# Patient Record
Sex: Female | Born: 1950
Health system: Southern US, Community
[De-identification: ages and names within clinical notes are randomized; demographics above are authoritative.]

## PROBLEM LIST (undated history)

## (undated) DIAGNOSIS — I82409 Acute embolism and thrombosis of unspecified deep veins of unspecified lower extremity: Secondary | ICD-10-CM

## (undated) DIAGNOSIS — I10 Essential (primary) hypertension: Secondary | ICD-10-CM

## (undated) DIAGNOSIS — F419 Anxiety disorder, unspecified: Secondary | ICD-10-CM

## (undated) HISTORY — PX: NO PAST SURGERIES: SHX2092

---

## 2017-09-18 DIAGNOSIS — M2170 Unequal limb length (acquired), unspecified site: Secondary | ICD-10-CM | POA: Diagnosis not present

## 2017-09-18 DIAGNOSIS — J301 Allergic rhinitis due to pollen: Secondary | ICD-10-CM | POA: Diagnosis not present

## 2017-09-18 DIAGNOSIS — I10 Essential (primary) hypertension: Secondary | ICD-10-CM | POA: Diagnosis not present

## 2017-09-19 DIAGNOSIS — H2513 Age-related nuclear cataract, bilateral: Secondary | ICD-10-CM | POA: Diagnosis not present

## 2017-09-19 DIAGNOSIS — H04123 Dry eye syndrome of bilateral lacrimal glands: Secondary | ICD-10-CM | POA: Diagnosis not present

## 2017-10-08 DIAGNOSIS — M2170 Unequal limb length (acquired), unspecified site: Secondary | ICD-10-CM | POA: Diagnosis not present

## 2017-10-08 DIAGNOSIS — R102 Pelvic and perineal pain: Secondary | ICD-10-CM | POA: Diagnosis not present

## 2017-10-08 DIAGNOSIS — M5136 Other intervertebral disc degeneration, lumbar region: Secondary | ICD-10-CM | POA: Diagnosis not present

## 2017-11-21 DIAGNOSIS — E041 Nontoxic single thyroid nodule: Secondary | ICD-10-CM | POA: Diagnosis not present

## 2017-11-21 DIAGNOSIS — M25511 Pain in right shoulder: Secondary | ICD-10-CM | POA: Diagnosis not present

## 2017-11-21 DIAGNOSIS — S161XXA Strain of muscle, fascia and tendon at neck level, initial encounter: Secondary | ICD-10-CM | POA: Diagnosis not present

## 2017-11-21 DIAGNOSIS — M542 Cervicalgia: Secondary | ICD-10-CM | POA: Diagnosis not present

## 2017-11-21 DIAGNOSIS — R51 Headache: Secondary | ICD-10-CM | POA: Diagnosis not present

## 2017-11-21 DIAGNOSIS — S0990XA Unspecified injury of head, initial encounter: Secondary | ICD-10-CM | POA: Diagnosis not present

## 2017-11-21 DIAGNOSIS — S199XXA Unspecified injury of neck, initial encounter: Secondary | ICD-10-CM | POA: Diagnosis not present

## 2017-12-19 DIAGNOSIS — L819 Disorder of pigmentation, unspecified: Secondary | ICD-10-CM | POA: Diagnosis not present

## 2017-12-19 DIAGNOSIS — I10 Essential (primary) hypertension: Secondary | ICD-10-CM | POA: Diagnosis not present

## 2017-12-19 DIAGNOSIS — Z0000 Encounter for general adult medical examination without abnormal findings: Secondary | ICD-10-CM | POA: Diagnosis not present

## 2017-12-19 DIAGNOSIS — Z803 Family history of malignant neoplasm of breast: Secondary | ICD-10-CM | POA: Diagnosis not present

## 2017-12-19 DIAGNOSIS — F4322 Adjustment disorder with anxiety: Secondary | ICD-10-CM | POA: Diagnosis not present

## 2017-12-19 DIAGNOSIS — Z1231 Encounter for screening mammogram for malignant neoplasm of breast: Secondary | ICD-10-CM | POA: Diagnosis not present

## 2017-12-19 DIAGNOSIS — L2084 Intrinsic (allergic) eczema: Secondary | ICD-10-CM | POA: Diagnosis not present

## 2017-12-19 DIAGNOSIS — E041 Nontoxic single thyroid nodule: Secondary | ICD-10-CM | POA: Diagnosis not present

## 2018-02-05 DIAGNOSIS — E041 Nontoxic single thyroid nodule: Secondary | ICD-10-CM | POA: Diagnosis not present

## 2018-02-05 DIAGNOSIS — E042 Nontoxic multinodular goiter: Secondary | ICD-10-CM | POA: Diagnosis not present

## 2018-04-08 DIAGNOSIS — H6122 Impacted cerumen, left ear: Secondary | ICD-10-CM | POA: Diagnosis not present

## 2018-04-08 DIAGNOSIS — J301 Allergic rhinitis due to pollen: Secondary | ICD-10-CM | POA: Diagnosis not present

## 2018-04-08 DIAGNOSIS — H6983 Other specified disorders of Eustachian tube, bilateral: Secondary | ICD-10-CM | POA: Diagnosis not present

## 2018-06-16 DIAGNOSIS — D692 Other nonthrombocytopenic purpura: Secondary | ICD-10-CM | POA: Diagnosis not present

## 2018-06-16 DIAGNOSIS — C44519 Basal cell carcinoma of skin of other part of trunk: Secondary | ICD-10-CM | POA: Diagnosis not present

## 2018-06-16 DIAGNOSIS — D485 Neoplasm of uncertain behavior of skin: Secondary | ICD-10-CM | POA: Diagnosis not present

## 2018-06-16 DIAGNOSIS — L814 Other melanin hyperpigmentation: Secondary | ICD-10-CM | POA: Diagnosis not present

## 2018-06-16 DIAGNOSIS — L821 Other seborrheic keratosis: Secondary | ICD-10-CM | POA: Diagnosis not present

## 2018-06-19 DIAGNOSIS — I82461 Acute embolism and thrombosis of right calf muscular vein: Secondary | ICD-10-CM | POA: Diagnosis not present

## 2018-06-19 DIAGNOSIS — I82411 Acute embolism and thrombosis of right femoral vein: Secondary | ICD-10-CM | POA: Diagnosis not present

## 2018-06-19 DIAGNOSIS — M19079 Primary osteoarthritis, unspecified ankle and foot: Secondary | ICD-10-CM | POA: Diagnosis not present

## 2018-06-19 DIAGNOSIS — M7989 Other specified soft tissue disorders: Secondary | ICD-10-CM | POA: Diagnosis not present

## 2018-06-19 DIAGNOSIS — R600 Localized edema: Secondary | ICD-10-CM | POA: Diagnosis not present

## 2018-06-19 DIAGNOSIS — I82431 Acute embolism and thrombosis of right popliteal vein: Secondary | ICD-10-CM | POA: Diagnosis not present

## 2018-06-19 DIAGNOSIS — I82441 Acute embolism and thrombosis of right tibial vein: Secondary | ICD-10-CM | POA: Diagnosis not present

## 2018-06-22 DIAGNOSIS — I82431 Acute embolism and thrombosis of right popliteal vein: Secondary | ICD-10-CM | POA: Diagnosis not present

## 2018-07-15 DIAGNOSIS — Z13228 Encounter for screening for other metabolic disorders: Secondary | ICD-10-CM | POA: Diagnosis not present

## 2018-07-15 DIAGNOSIS — Z0000 Encounter for general adult medical examination without abnormal findings: Secondary | ICD-10-CM | POA: Diagnosis not present

## 2018-07-23 DIAGNOSIS — J329 Chronic sinusitis, unspecified: Secondary | ICD-10-CM | POA: Diagnosis not present

## 2018-07-23 DIAGNOSIS — H6983 Other specified disorders of Eustachian tube, bilateral: Secondary | ICD-10-CM | POA: Diagnosis not present

## 2018-07-28 DIAGNOSIS — E871 Hypo-osmolality and hyponatremia: Secondary | ICD-10-CM | POA: Diagnosis not present

## 2018-07-28 DIAGNOSIS — K591 Functional diarrhea: Secondary | ICD-10-CM | POA: Diagnosis not present

## 2018-07-28 DIAGNOSIS — I82431 Acute embolism and thrombosis of right popliteal vein: Secondary | ICD-10-CM | POA: Diagnosis not present

## 2018-08-06 DIAGNOSIS — J0140 Acute pansinusitis, unspecified: Secondary | ICD-10-CM | POA: Diagnosis not present

## 2018-08-06 DIAGNOSIS — I10 Essential (primary) hypertension: Secondary | ICD-10-CM | POA: Diagnosis not present

## 2018-08-06 DIAGNOSIS — F5102 Adjustment insomnia: Secondary | ICD-10-CM | POA: Diagnosis not present

## 2018-08-10 ENCOUNTER — Encounter: Payer: Self-pay | Admitting: Emergency Medicine

## 2018-08-10 ENCOUNTER — Emergency Department: Payer: PPO

## 2018-08-10 ENCOUNTER — Inpatient Hospital Stay
Admission: EM | Admit: 2018-08-10 | Discharge: 2018-08-14 | DRG: 640 | Disposition: A | Discharge status: Home / Self Care | Payer: PPO | Attending: Internal Medicine | Admitting: Internal Medicine

## 2018-08-10 DIAGNOSIS — F331 Major depressive disorder, recurrent, moderate: Secondary | ICD-10-CM | POA: Diagnosis present

## 2018-08-10 DIAGNOSIS — I639 Cerebral infarction, unspecified: Secondary | ICD-10-CM

## 2018-08-10 DIAGNOSIS — G9340 Encephalopathy, unspecified: Secondary | ICD-10-CM

## 2018-08-10 DIAGNOSIS — Z7189 Other specified counseling: Secondary | ICD-10-CM | POA: Diagnosis not present

## 2018-08-10 DIAGNOSIS — I6340 Cerebral infarction due to embolism of unspecified cerebral artery: Secondary | ICD-10-CM | POA: Diagnosis not present

## 2018-08-10 DIAGNOSIS — Z86718 Personal history of other venous thrombosis and embolism: Secondary | ICD-10-CM | POA: Diagnosis not present

## 2018-08-10 DIAGNOSIS — Z881 Allergy status to other antibiotic agents status: Secondary | ICD-10-CM | POA: Diagnosis not present

## 2018-08-10 DIAGNOSIS — Z79899 Other long term (current) drug therapy: Secondary | ICD-10-CM | POA: Diagnosis not present

## 2018-08-10 DIAGNOSIS — Z7901 Long term (current) use of anticoagulants: Secondary | ICD-10-CM

## 2018-08-10 DIAGNOSIS — J9811 Atelectasis: Secondary | ICD-10-CM | POA: Diagnosis not present

## 2018-08-10 DIAGNOSIS — I420 Dilated cardiomyopathy: Secondary | ICD-10-CM | POA: Diagnosis not present

## 2018-08-10 DIAGNOSIS — I10 Essential (primary) hypertension: Secondary | ICD-10-CM | POA: Diagnosis not present

## 2018-08-10 DIAGNOSIS — E878 Other disorders of electrolyte and fluid balance, not elsewhere classified: Secondary | ICD-10-CM | POA: Diagnosis present

## 2018-08-10 DIAGNOSIS — Z9114 Patient's other noncompliance with medication regimen: Secondary | ICD-10-CM | POA: Diagnosis not present

## 2018-08-10 DIAGNOSIS — I63233 Cerebral infarction due to unspecified occlusion or stenosis of bilateral carotid arteries: Secondary | ICD-10-CM | POA: Diagnosis not present

## 2018-08-10 DIAGNOSIS — M1990 Unspecified osteoarthritis, unspecified site: Secondary | ICD-10-CM | POA: Diagnosis present

## 2018-08-10 DIAGNOSIS — J019 Acute sinusitis, unspecified: Secondary | ICD-10-CM | POA: Diagnosis present

## 2018-08-10 DIAGNOSIS — I6350 Cerebral infarction due to unspecified occlusion or stenosis of unspecified cerebral artery: Secondary | ICD-10-CM | POA: Diagnosis not present

## 2018-08-10 DIAGNOSIS — R1310 Dysphagia, unspecified: Secondary | ICD-10-CM | POA: Diagnosis present

## 2018-08-10 DIAGNOSIS — I429 Cardiomyopathy, unspecified: Secondary | ICD-10-CM | POA: Diagnosis not present

## 2018-08-10 DIAGNOSIS — I493 Ventricular premature depolarization: Secondary | ICD-10-CM | POA: Diagnosis not present

## 2018-08-10 DIAGNOSIS — E871 Hypo-osmolality and hyponatremia: Secondary | ICD-10-CM | POA: Diagnosis present

## 2018-08-10 DIAGNOSIS — E876 Hypokalemia: Secondary | ICD-10-CM | POA: Diagnosis not present

## 2018-08-10 DIAGNOSIS — G92 Toxic encephalopathy: Secondary | ICD-10-CM | POA: Diagnosis not present

## 2018-08-10 DIAGNOSIS — Z803 Family history of malignant neoplasm of breast: Secondary | ICD-10-CM | POA: Diagnosis not present

## 2018-08-10 DIAGNOSIS — Z6841 Body Mass Index (BMI) 40.0 and over, adult: Secondary | ICD-10-CM

## 2018-08-10 DIAGNOSIS — I351 Nonrheumatic aortic (valve) insufficiency: Secondary | ICD-10-CM | POA: Diagnosis not present

## 2018-08-10 DIAGNOSIS — I25118 Atherosclerotic heart disease of native coronary artery with other forms of angina pectoris: Secondary | ICD-10-CM | POA: Diagnosis not present

## 2018-08-10 DIAGNOSIS — E860 Dehydration: Principal | ICD-10-CM | POA: Diagnosis present

## 2018-08-10 DIAGNOSIS — I340 Nonrheumatic mitral (valve) insufficiency: Secondary | ICD-10-CM | POA: Diagnosis not present

## 2018-08-10 DIAGNOSIS — I6310 Cerebral infarction due to embolism of unspecified precerebral artery: Secondary | ICD-10-CM | POA: Diagnosis not present

## 2018-08-10 DIAGNOSIS — I361 Nonrheumatic tricuspid (valve) insufficiency: Secondary | ICD-10-CM | POA: Diagnosis not present

## 2018-08-10 HISTORY — DX: Anxiety disorder, unspecified: F41.9

## 2018-08-10 HISTORY — DX: Acute embolism and thrombosis of unspecified deep veins of unspecified lower extremity: I82.409

## 2018-08-10 HISTORY — DX: Essential (primary) hypertension: I10

## 2018-08-10 LAB — BASIC METABOLIC PANEL
Anion gap: 12 (ref 5–15)
BUN: 14 mg/dL (ref 8–23)
CHLORIDE: 92 mmol/L — AB (ref 98–111)
CO2: 25 mmol/L (ref 22–32)
Calcium: 8 mg/dL — ABNORMAL LOW (ref 8.9–10.3)
Creatinine, Ser: 0.55 mg/dL (ref 0.44–1.00)
GFR calc Af Amer: >60 mL/min (ref >60)
GFR calc non Af Amer: >60 mL/min (ref >60)
Glucose, Bld: 99 mg/dL (ref 70–99)
Potassium: 3.5 mmol/L (ref 3.5–5.1)
Sodium: 129 mmol/L — ABNORMAL LOW (ref 135–145)

## 2018-08-10 LAB — COMPREHENSIVE METABOLIC PANEL
ALT: 15 U/L (ref 0–44)
AST: 28 U/L (ref 15–41)
Albumin: 3.4 g/dL — ABNORMAL LOW (ref 3.5–5.0)
Alkaline Phosphatase: 297 U/L — ABNORMAL HIGH (ref 38–126)
Anion gap: 15 (ref 5–15)
BUN: 16 mg/dL (ref 8–23)
CO2: 24 mmol/L (ref 22–32)
CREATININE: 0.56 mg/dL (ref 0.44–1.00)
Calcium: 9.1 mg/dL (ref 8.9–10.3)
Chloride: 86 mmol/L — ABNORMAL LOW (ref 98–111)
GFR calc Af Amer: >60 mL/min (ref >60)
GFR calc non Af Amer: >60 mL/min (ref >60)
Glucose, Bld: 119 mg/dL — ABNORMAL HIGH (ref 70–99)
Potassium: 2.8 mmol/L — ABNORMAL LOW (ref 3.5–5.1)
Sodium: 125 mmol/L — ABNORMAL LOW (ref 135–145)
Total Bilirubin: 1.5 mg/dL — ABNORMAL HIGH (ref 0.3–1.2)
Total Protein: 7.4 g/dL (ref 6.5–8.1)

## 2018-08-10 LAB — URINALYSIS, COMPLETE (UACMP) WITH MICROSCOPIC
Bacteria, UA: NONE SEEN
Bilirubin Urine: NEGATIVE
Glucose, UA: NEGATIVE mg/dL
Ketones, ur: 80 mg/dL — AB
LEUKOCYTE UA: NEGATIVE
Nitrite: NEGATIVE
Protein, ur: 100 mg/dL — AB
Specific Gravity, Urine: 1.026 (ref 1.005–1.030)
pH: 6 (ref 5.0–8.0)

## 2018-08-10 LAB — CBC
HCT: 38.6 % (ref 36.0–46.0)
Hemoglobin: 13.2 g/dL (ref 12.0–15.0)
MCH: 28.4 pg (ref 26.0–34.0)
MCHC: 34.2 g/dL (ref 30.0–36.0)
MCV: 83 fL (ref 80.0–100.0)
NRBC: 0 % (ref 0.0–0.2)
Platelets: 143 10*3/uL — ABNORMAL LOW (ref 150–400)
RBC: 4.65 MIL/uL (ref 3.87–5.11)
RDW: 13.7 % (ref 11.5–15.5)
WBC: 17.1 10*3/uL — ABNORMAL HIGH (ref 4.0–10.5)

## 2018-08-10 LAB — GLUCOSE, CAPILLARY
Glucose-Capillary: 93 mg/dL (ref 70–99)
Glucose-Capillary: 87 mg/dL (ref 70–99)

## 2018-08-10 LAB — SODIUM, URINE, RANDOM: Sodium, Ur: <10 mmol/L

## 2018-08-10 LAB — MAGNESIUM: Magnesium: 1.7 mg/dL (ref 1.7–2.4)

## 2018-08-10 LAB — OSMOLALITY, URINE: Osmolality, Ur: 648 mOsm/kg (ref 300–900)

## 2018-08-10 MED ORDER — KETAMINE HCL 10 MG/ML IJ SOLN
1.0000 mg/kg | Freq: Once | INTRAMUSCULAR | Status: DC
  Filled 2018-08-10: qty 1

## 2018-08-10 MED ORDER — LORAZEPAM 2 MG/ML IJ SOLN
2.0000 mg | Freq: Once | INTRAMUSCULAR | Status: AC
  Administered 2018-08-10: 2 mg via INTRAVENOUS

## 2018-08-10 MED ORDER — ACETAMINOPHEN 325 MG PO TABS
650.0000 mg | ORAL_TABLET | Freq: Four times a day (QID) | ORAL | Status: DC | PRN
  Administered 2018-08-10: 650 mg via ORAL
  Filled 2018-08-10: qty 2

## 2018-08-10 MED ORDER — ONDANSETRON HCL 4 MG PO TABS: 4.0000 mg | ORAL_TABLET | Freq: Four times a day (QID) | ORAL | Status: DC | PRN

## 2018-08-10 MED ORDER — KETAMINE HCL 10 MG/ML IJ SOLN
INTRAMUSCULAR | Status: AC | PRN
  Administered 2018-08-10: 104 mg via INTRAVENOUS

## 2018-08-10 MED ORDER — PANTOPRAZOLE SODIUM 40 MG PO TBEC: 40.0000 mg | DELAYED_RELEASE_TABLET | Freq: Every day | ORAL | Status: DC | PRN

## 2018-08-10 MED ORDER — LORAZEPAM 2 MG/ML IJ SOLN
INTRAMUSCULAR | Status: AC
  Administered 2018-08-10: 1 mg via INTRAVENOUS
  Filled 2018-08-10: qty 1

## 2018-08-10 MED ORDER — POTASSIUM CHLORIDE 10 MEQ/100ML IV SOLN
10.0000 meq | INTRAVENOUS | Status: DC
  Filled 2018-08-10 (×2): qty 100

## 2018-08-10 MED ORDER — FLUTICASONE PROPIONATE 50 MCG/ACT NA SUSP
2.0000 | Freq: Every day | NASAL | Status: DC
  Administered 2018-08-11 – 2018-08-14 (×4): 2 via NASAL
  Filled 2018-08-10: qty 16

## 2018-08-10 MED ORDER — ACETAMINOPHEN 650 MG RE SUPP: 650.0000 mg | Freq: Four times a day (QID) | RECTAL | Status: DC | PRN

## 2018-08-10 MED ORDER — ONDANSETRON HCL 4 MG/2ML IJ SOLN: 4.0000 mg | Freq: Four times a day (QID) | INTRAMUSCULAR | Status: DC | PRN

## 2018-08-10 MED ORDER — APIXABAN 5 MG PO TABS
5.0000 mg | ORAL_TABLET | Freq: Two times a day (BID) | ORAL | Status: DC
  Administered 2018-08-10 – 2018-08-14 (×8): 5 mg via ORAL
  Filled 2018-08-10 (×8): qty 1

## 2018-08-10 MED ORDER — LISINOPRIL 20 MG PO TABS
40.0000 mg | ORAL_TABLET | Freq: Every day | ORAL | Status: DC
  Administered 2018-08-11 – 2018-08-14 (×4): 40 mg via ORAL
  Filled 2018-08-10: qty 4
  Filled 2018-08-10 (×4): qty 2

## 2018-08-10 MED ORDER — POTASSIUM CHLORIDE 10 MEQ/100ML IV SOLN: 10.0000 meq | Freq: Once | INTRAVENOUS | Status: DC

## 2018-08-10 MED ORDER — PAROXETINE HCL 20 MG PO TABS
20.0000 mg | ORAL_TABLET | Freq: Every day | ORAL | Status: DC
  Filled 2018-08-10: qty 1

## 2018-08-10 MED ORDER — LORAZEPAM 2 MG/ML IJ SOLN: 1.0000 mg | Freq: Once | INTRAMUSCULAR | Status: DC

## 2018-08-10 MED ORDER — SODIUM CHLORIDE 0.9 % IV SOLN
2.0000 g | INTRAVENOUS | Status: DC
  Administered 2018-08-10: 2 g via INTRAVENOUS
  Filled 2018-08-10 (×2): qty 20

## 2018-08-10 MED ORDER — HALOPERIDOL LACTATE 5 MG/ML IJ SOLN
1.0000 mg | Freq: Four times a day (QID) | INTRAMUSCULAR | Status: DC | PRN
  Administered 2018-08-11 – 2018-08-12 (×2): 1 mg via INTRAVENOUS
  Filled 2018-08-10 (×4): qty 1

## 2018-08-10 MED ORDER — SODIUM CHLORIDE 0.9 % IV BOLUS
1000.0000 mL | Freq: Once | INTRAVENOUS | Status: AC
  Administered 2018-08-10: 1000 mL via INTRAVENOUS

## 2018-08-10 MED ORDER — HYDRALAZINE HCL 20 MG/ML IJ SOLN: 5.0000 mg | Freq: Four times a day (QID) | INTRAMUSCULAR | Status: DC | PRN

## 2018-08-10 MED ORDER — LORAZEPAM 2 MG/ML IJ SOLN
1.0000 mg | Freq: Once | INTRAMUSCULAR | Status: AC
  Administered 2018-08-10: 1 mg via INTRAVENOUS

## 2018-08-10 MED ORDER — LORAZEPAM 2 MG/ML IJ SOLN
INTRAMUSCULAR | Status: AC
  Filled 2018-08-10: qty 1

## 2018-08-10 MED ORDER — MAGNESIUM SULFATE 2 GM/50ML IV SOLN
2.0000 g | Freq: Once | INTRAVENOUS | Status: AC
  Administered 2018-08-10: 2 g via INTRAVENOUS
  Filled 2018-08-10: qty 50

## 2018-08-10 MED ORDER — POTASSIUM CHLORIDE IN NACL 20-0.9 MEQ/L-% IV SOLN
INTRAVENOUS | Status: DC
  Administered 2018-08-10 – 2018-08-14 (×5): via INTRAVENOUS
  Filled 2018-08-10 (×10): qty 1000

## 2018-08-10 NOTE — ED Provider Notes (Signed)
[facility] Emergency Department Provider Note  ____________________________________________  Time seen: Approximately 8:33 AM  I have reviewed the triage vital signs and the nursing notes.   HISTORY  Chief Complaint Facial Pain and Altered Mental Status  Level 5 caveat:  Portions of the history and physical were unable to be obtained due to encephalopathy   HPI Diana Todd is a 68 y.o. female with a history of anxiety, depression, hypertension, DVT on Eliquis who presents for evaluation of altered mental status.  History is gathered mostly from patient's daughter who was at bedside.  According to the daughter patient was started on Xarelto 2 to 3 weeks ago for DVT.  She did not tolerate it well and had significant nausea and decreased oral intake.  A week ago her Xarelto was switched to Eliquis.  Patient continues to have a poor appetite and decreased oral intake.  This morning patient was extremely confused, unable to follow commands or answer to any questions which prompted the visit to the emergency room.  Patient has had a mild cough and sinus congestion.  She is currently on amoxicillin for that.  No vomiting or diarrhea, no difficulty breathing.  No falls.  According to the daughter, patient works 2 jobs and is completely normal otherwise at baseline.  Past Medical History:  Diagnosis Date  . Anxiety   . DVT (deep venous thrombosis) ([place])   . Hypertension     Patient Active Problem List   Diagnosis Date Noted  . Hyponatremia 08/10/2018    Past Surgical History:  Procedure Laterality Date  . NO PAST SURGERIES      Prior to Admission medications   Medication Sig Start Date End Date Taking? Authorizing Provider  amoxicillin-clavulanate (AUGMENTIN) 875-125 MG tablet Take 1 tablet by mouth 2 (two) times daily. 08/06/18 08/13/18 Yes [provider]  apixaban (ELIQUIS) 5 MG TABS tablet Take 5 mg by mouth 2 (two) times daily. 07/28/18  08/27/18 Yes [provider]  atenolol (TENORMIN) 50 MG tablet Take 50 mg by mouth daily. 07/15/18  Yes [provider]  fluticasone (FLONASE) 50 MCG/ACT nasal spray Place 2 sprays into both nostrils daily.   Yes [provider]  hydrocortisone 2.5 % cream Apply 1 application topically daily as needed for rash.   Yes [provider]  lisinopril (PRINIVIL,ZESTRIL) 40 MG tablet Take 40 mg by mouth at bedtime. 07/15/18  Yes [provider]  pantoprazole (PROTONIX) 40 MG tablet Take 40 mg by mouth daily as needed for nausea or heartburn. 07/27/18  Yes [provider]  PARoxetine (PAXIL) 30 MG tablet Take 60 mg by mouth daily.   Yes [provider]    Allergies Patient has no known allergies.  Family History  Problem Relation Age of Onset  . Breast cancer Mother   . AAA (abdominal aortic aneurysm) Father     Social History Social History   Tobacco Use  . Smoking status: Never Smoker  . Smokeless tobacco: Never Used  Substance Use Topics  . Alcohol use: Never    Frequency: Never  . Drug use: Never    Review of Systems  Constitutional: + confusion Respiratory: + cough  ____________________________________________   PHYSICAL EXAM:  VITAL SIGNS: ED Triage Vitals  Enc Vitals Group     BP 08/10/18 0758 (!) 148/71     Pulse Rate 08/10/18 0758 86     Resp 08/10/18 0758 18     Temp 08/10/18 0758 (!) 97.4 F (  36.3 C)     Temp Source 08/10/18 0758 Oral     SpO2 08/10/18 0758 99 %     Weight 08/10/18 0759 229 lb (103.9 kg)     Height 08/10/18 0759 5\' 3"  (1.6 m)     Head Circumference --      Peak Flow --      Pain Score 08/10/18 0759 0     Pain Loc --      Pain Edu? --      Excl. in [place]? --     Constitutional: Awake, disoriented, only able to say her first name HEENT:      Head: Normocephalic and atraumatic.         Eyes: Conjunctivae are normal. Sclera is non-icteric.       Mouth/Throat: Mucous membranes are dry.         Neck: Supple with no signs of meningismus. Cardiovascular: Regular rate and rhythm. No murmurs, gallops, or rubs. 2+ symmetrical distal pulses are present in all extremities. No JVD. Respiratory: Normal respiratory effort. Lungs are clear to auscultation bilaterally. No wheezes, crackles, or rhonchi.  Gastrointestinal: Soft, non tender, and non distended with positive bowel sounds. No rebound or guarding. Musculoskeletal: Nontender with normal range of motion in all extremities. No edema, cyanosis, or erythema of extremities. Neurologic: Confused, nonsensical speech, does not follow any commands but is moving all 4 extremities spontaneously and face is symmetric. Skin: Skin is warm, dry and intact. No rash noted.  ____________________________________________   LABS (all labs ordered are listed, but only abnormal results are displayed)  Labs Reviewed  COMPREHENSIVE METABOLIC PANEL - Abnormal; Notable for the following components:      Result Value   Sodium 125 (*)    Potassium 2.8 (*)    Chloride 86 (*)    Glucose, Bld 119 (*)    Albumin 3.4 (*)    Alkaline Phosphatase 297 (*)    Total Bilirubin 1.5 (*)    All other components within normal limits  CBC - Abnormal; Notable for the following components:   WBC 17.1 (*)    Platelets 143 (*)    All other components within normal limits  URINALYSIS, COMPLETE (UACMP) WITH MICROSCOPIC - Abnormal; Notable for the following components:   Color, Urine AMBER (*)    APPearance CLEAR (*)    Hgb urine dipstick MODERATE (*)    Ketones, ur 80 (*)    Protein, ur 100 (*)    All other components within normal limits  BASIC METABOLIC PANEL - Abnormal; Notable for the following components:   Sodium 129 (*)    Chloride 92 (*)    Calcium 8.0 (*)    All other components within normal limits  CULTURE, BLOOD (ROUTINE X 2)  CULTURE, BLOOD (ROUTINE X 2)  GLUCOSE, CAPILLARY  MAGNESIUM  SODIUM, URINE, RANDOM  OSMOLALITY, URINE  GLUCOSE,  CAPILLARY  HIV ANTIBODY (ROUTINE TESTING W REFLEX)  CBG MONITORING, ED   ____________________________________________  EKG  ED ECG REPORT I, Rudene Re, the attending physician, personally viewed and interpreted this ECG.  Normal sinus rhythm with rare PVCs, rate of 94, normal intervals, normal axis, LVH, no ST elevations or depressions.  No prior for comparison. ____________________________________________  RADIOLOGY  I have personally reviewed the images performed during this visit and I agree with the Radiologist's read.   Interpretation by Radiologist:  Dg Chest 2 View  Result Date: 08/10/2018 CLINICAL DATA:  Confusion EXAM: CHEST - 2 VIEW COMPARISON:  None. FINDINGS:  Lungs are under aerated with bibasilar atelectasis. The heart is a accentuated by low volumes. It is likely normal in size. No pneumothorax or pleural effusion. Normal vascularity. IMPRESSION: Bibasilar atelectasis. Electronically Signed   By: Marybelle Killings M.D.   On: 08/10/2018 08:48   Ct Head Wo Contrast  Result Date: 08/10/2018 CLINICAL DATA:  Encephalopathy EXAM: CT HEAD WITHOUT CONTRAST TECHNIQUE: Contiguous axial images were obtained from the base of the skull through the vertex without intravenous contrast. COMPARISON:  11/21/2017 FINDINGS: Brain: Stable old lacunar infarct in the left internal capsule. No acute intracranial abnormality. Specifically, no hemorrhage, hydrocephalus, mass lesion, acute infarction, or significant intracranial injury. Vascular: No hyperdense vessel or unexpected calcification. Skull: No acute calvarial abnormality. Sinuses/Orbits: Mucosal thickening and air-fluid level in the left maxillary sinus. Other: None IMPRESSION: No acute intracranial abnormality. Stable old left internal capsule lacunar infarct. Acute on chronic left maxillary sinusitis. Electronically Signed   By: Rolm Baptise M.D.   On: 08/10/2018 09:57       ____________________________________________   PROCEDURES  Procedure(s) performed:yes .Sedation Date/Time: 08/10/2018 10:14 AM Performed by: Rudene Re, MD Authorized by: Rudene Re, MD   Consent:    Consent obtained:  Written (electronic informed consent)   Consent given by:  Guardian   Risks discussed:  Allergic reaction, dysrhythmia, inadequate sedation, nausea, vomiting, respiratory compromise necessitating ventilatory assistance and intubation, prolonged sedation necessitating reversal and prolonged hypoxia resulting in organ damage Universal protocol:    Procedure explained and questions answered to patient or proxy's satisfaction: yes     Relevant documents present and verified: yes     Test results available and properly labeled: yes     Imaging studies available: yes     Required blood products, implants, devices, and special equipment available: yes     Immediately prior to procedure a time out was called: yes     Patient identity confirmation method:  Arm band Indications:    Procedure performed:  Imaging studies   Procedure necessitating sedation performed by:  Different physician Pre-sedation assessment:    Time since last food or drink:  12   ASA classification: class 2 - patient with mild systemic disease     Neck mobility: normal     Mouth opening:  3 or more finger widths   Thyromental distance:  4 finger widths   Mallampati score:  I - soft palate, uvula, fauces, pillars visible   Pre-sedation assessments completed and reviewed: airway patency, cardiovascular function, hydration status, mental status, nausea/vomiting, respiratory function and temperature     Pre-sedation assessment completed:  08/10/2018 9:30 AM Immediate pre-procedure details:    Reassessment: Patient reassessed immediately prior to procedure     Reviewed: vital signs, relevant labs/tests and NPO status     Verified: bag valve mask available, emergency equipment available,  intubation equipment available, IV patency confirmed, oxygen available, reversal medications available and suction available   Procedure details (see MAR for exact dosages):    Preoxygenation:  Room air   Sedation:  Ketamine   Intra-procedure monitoring:  Blood pressure monitoring, continuous pulse oximetry, cardiac monitor, frequent vital sign checks and frequent LOC assessments   Total Provider sedation time (minutes):  15 Post-procedure details:    Post-sedation assessment completed:  08/10/2018 10:15 AM   Attendance: Constant attendance by certified staff until patient recovered     Recovery: Patient returned to pre-procedure baseline     Post-sedation assessments completed and reviewed: airway patency, cardiovascular function, hydration status, mental status and  respiratory function     Patient is stable for discharge or admission: yes     Patient tolerance:  Tolerated well, no immediate complications   Critical Care performed: yes  CRITICAL CARE Performed by: Rudene Re  ?  Total critical care time: 30 min  Critical care time was exclusive of separately billable procedures and treating other patients.  Critical care was necessary to treat or prevent imminent or life-threatening deterioration.  Critical care was time spent personally by me on the following activities: development of treatment plan with patient and/or surrogate as well as nursing, discussions with consultants, evaluation of patient's response to treatment, examination of patient, obtaining history from patient or surrogate, ordering and performing treatments and interventions, ordering and review of laboratory studies, ordering and review of radiographic studies, pulse oximetry and re-evaluation of patient's condition.  ____________________________________________   INITIAL IMPRESSION / ASSESSMENT AND PLAN / ED COURSE  68 y.o. female with a history of anxiety, depression, hypertension, DVT on Eliquis who  presents for evaluation of altered mental status.  Patient is encephalopathic and unable to provide any history.  She is not following any commands, has nonsensical speech but otherwise moving all 4 extremities and with a symmetric face on exam.  EKG showing no acute ischemic changes.  Differential diagnosis is broad and includes intracranial pathologies such as bleed versus a stroke, dehydration, electrolyte abnormalities, infection.  Plan for labs, head CT, urinalysis.    _________________________ 10:15 AM on 08/10/2018 -----------------------------------------  Labs showing sodium of 125.  Patient looks dry on exam.  Will give a liter of normal saline and repeat BMP. Also hypokalemic, given K IV. Patient also has a white count of 17 with no fever or tachycardia.  Urinalysis is pending.  CT had was attempted several times however patient was very agitated and unable to cooperate.  She was given Ativan IV, initially 2mg  x 3 with no sedation achieved and unable to obtain CT. Therefore patient's daughter consented for conscious sedation with ketamine so CT could be done to rule out intracranial hemorrhage as possible etiology of patient's confusion.  CT is negative.  Discussed with Dr. Bonita Quin for admission   As part of my medical decision making, I reviewed the following data within the [place] History obtained from family, Nursing notes reviewed and incorporated, Labs reviewed , EKG interpreted , Old EKG reviewed, Old chart reviewed, Radiograph reviewed , Discussed with admitting physician , Notes from prior ED visits and [place] Controlled Substance Database    Pertinent labs & imaging results that were available during my care of the patient were reviewed by me and considered in my medical decision making (see chart for details).    ____________________________________________   FINAL CLINICAL IMPRESSION(S) / ED DIAGNOSES  Final diagnoses:  Encephalopathy acute   Hyponatremia  Hypokalemia      NEW MEDICATIONS STARTED DURING THIS VISIT:  ED Discharge Orders    None       Note:  This document was prepared using Dragon voice recognition software and may include unintentional dictation errors.    Alfred Levins, [place], MD 08/10/18 1556

## 2018-08-10 NOTE — Sedation Documentation (Signed)
This RN, Karl Bales, RN, Kathlee Nations, RN, Dr. Kerry Hough, NP Student and patient returned to room at this time. Pt tolerated deep sedation well. Will continue to monitor.

## 2018-08-10 NOTE — Consult Note (Signed)
[facility]  CONSULT NOTE    Date: 08/10/2018                  Patient Name:  Diana Todd  MRN: [id number]  DOB: [birth year]  Age / Sex: 68 y.o., female         PCP: System, Provider Not In                 Service Requesting Consult: Dr. Leslye Peer                 Reason for Consult: Hyponatremia            History of Present Illness: Ms. DOREENA MAULDEN is a 68 y.o. white female with recent DVT who has an upper respiratory infection. Started on amoxicillin on 2/27. Daughter at bedside states patient stopped going to work and has been tired and sleepy. Not eating or drinking well.   Medications: Outpatient medications: (Not in a hospital admission)   Current medications: Current Facility-Administered Medications  Medication Dose Route Frequency Provider Last Rate Last Dose  . 0.9 % NaCl with KCl 20 mEq/ L  infusion   Intravenous Continuous Loletha Grayer, MD 75 mL/hr at 08/10/18 1149    . acetaminophen (TYLENOL) tablet 650 mg  650 mg Oral Q6H PRN Loletha Grayer, MD       Or  . acetaminophen (TYLENOL) suppository 650 mg  650 mg Rectal Q6H PRN Wieting, Richard, MD      . apixaban (ELIQUIS) tablet 5 mg  5 mg Oral BID Wieting, Richard, MD      . cefTRIAXone (ROCEPHIN) 2 g in sodium chloride 0.9 % 100 mL IVPB  2 g Intravenous Q24H Loletha Grayer, MD   Stopped at 08/10/18 1355  . fluticasone (FLONASE) 50 MCG/ACT nasal spray 2 spray  2 spray Each Nare Daily Wieting, Richard, MD      . hydrALAZINE (APRESOLINE) injection 5 mg  5 mg Intravenous Q6H PRN Wieting, Richard, MD      . ketamine (KETALAR) injection 104 mg  1 mg/kg Intravenous Once Wieting, Richard, MD      . lisinopril (PRINIVIL,ZESTRIL) tablet 40 mg  40 mg Oral Daily Wieting, Richard, MD      . ondansetron [facility]) tablet 4 mg  4 mg Oral Q6H PRN Loletha Grayer, MD       Or  . ondansetron (ZOFRAN) injection 4 mg  4 mg Intravenous Q6H PRN Wieting, Richard, MD      . pantoprazole (PROTONIX) EC  tablet 40 mg  40 mg Oral Daily PRN Loletha Grayer, MD      . Derrill Memo ON 08/11/2018] PARoxetine (PAXIL) tablet 20 mg  20 mg Oral Daily Wieting, Richard, MD       Current Outpatient Medications  Medication Sig Dispense Refill  . amoxicillin-clavulanate (AUGMENTIN) 875-125 MG tablet Take 1 tablet by mouth 2 (two) times daily.    Marland Kitchen apixaban (ELIQUIS) 5 MG TABS tablet Take 5 mg by mouth 2 (two) times daily.    Marland Kitchen atenolol (TENORMIN) 50 MG tablet Take 50 mg by mouth daily.    . fluticasone (FLONASE) 50 MCG/ACT nasal spray Place 2 sprays into both nostrils daily.    . hydrocortisone 2.5 % cream Apply 1 application topically daily as needed for rash.    . lisinopril (PRINIVIL,ZESTRIL) 40 MG tablet Take 40 mg by mouth at bedtime.    . pantoprazole (PROTONIX) 40 MG tablet Take 40 mg by mouth daily as needed for  nausea or heartburn.    Marland Kitchen PARoxetine (PAXIL) 30 MG tablet Take 60 mg by mouth daily.        Allergies: No Known Allergies    Past Medical History: Past Medical History:  Diagnosis Date  . Anxiety   . DVT (deep venous thrombosis) ([place])   . Hypertension      Past Surgical History: Past Surgical History:  Procedure Laterality Date  . NO PAST SURGERIES       Family History: Family History  Problem Relation Age of Onset  . Breast cancer Mother   . AAA (abdominal aortic aneurysm) Father      Social History: Social History   Socioeconomic History  . Marital status: Unknown    Spouse name: Not on file  . Number of children: Not on file  . Years of education: Not on file  . Highest education level: Not on file  Occupational History  . Not on file  Social Needs  . Financial resource strain: Not on file  . Food insecurity:    Worry: Not on file    Inability: Not on file  . Transportation needs:    Medical: Not on file    Non-medical: Not on file  Tobacco Use  . Smoking status: Never Smoker  . Smokeless tobacco: Never Used  Substance and Sexual Activity  . Alcohol  use: Never    Frequency: Never  . Drug use: Never  . Sexual activity: Not on file  Lifestyle  . Physical activity:    Days per week: Not on file    Minutes per session: Not on file  . Stress: Not on file  Relationships  . Social connections:    Talks on phone: Not on file    Gets together: Not on file    Attends religious service: Not on file    Active member of club or organization: Not on file    Attends meetings of clubs or organizations: Not on file    Relationship status: Not on file  . Intimate partner violence:    Fear of current or ex partner: Not on file    Emotionally abused: Not on file    Physically abused: Not on file    Forced sexual activity: Not on file  Other Topics Concern  . Not on file  Social History Narrative  . Not on file     Review of Systems: Review of Systems  Unable to perform ROS: Mental status change    Vital Signs: Blood pressure (!) 179/84, pulse (!) 103, temperature (!) 97.4 F (36.3 C), temperature source Oral, resp. rate (!) 23, height 5\' 3"  (1.6 m), weight 103.9 kg, SpO2 100 %.  Weight trends: Filed Weights   08/10/18 0759  Weight: 103.9 kg    Physical Exam: General: NAD, laying on bed  Head: Normocephalic, atraumatic. Moist oral mucosal membranes  Eyes: Anicteric, PERRL  Neck: Supple, trachea midline  Lungs:  Clear to auscultation  Heart: Regular rate and rhythm  Abdomen:  Soft, nontender,   Extremities: no peripheral edema.  Neurologic: lethargic  Skin: No lesions         Lab results: Basic Metabolic Panel: Recent Labs  Lab 08/10/18 0803 08/10/18 1327  NA 125* 129*  K 2.8* 3.5  CL 86* 92*  CO2 24 25  GLUCOSE 119* 99  BUN 16 14  CREATININE 0.56 0.55  CALCIUM 9.1 8.0*  MG 1.7  --     Liver Function Tests: Recent Labs  Lab  08/10/18 0803  AST 28  ALT 15  ALKPHOS 297*  BILITOT 1.5*  PROT 7.4  ALBUMIN 3.4*   No results for input(s): LIPASE, AMYLASE in the last 168 hours. No results for input(s):  AMMONIA in the last 168 hours.  CBC: Recent Labs  Lab 08/10/18 0803  WBC 17.1*  HGB 13.2  HCT 38.6  MCV 83.0  PLT 143*    Cardiac Enzymes: No results for input(s): CKTOTAL, CKMB, CKMBINDEX, TROPONINI in the last 168 hours.  BNP: Invalid input(s): POCBNP  CBG: Recent Labs  Lab 08/10/18 0755 08/10/18 1231  MMHWKG 88 11    Microbiology: No results found for this or any previous visit.  Coagulation Studies: No results for input(s): LABPROT, INR in the last 72 hours.  Urinalysis: Recent Labs    08/10/18 1020  COLORURINE AMBER*  LABSPEC 1.026  PHURINE 6.0  GLUCOSEU NEGATIVE  HGBUR MODERATE*  BILIRUBINUR NEGATIVE  KETONESUR 80*  PROTEINUR 100*  NITRITE NEGATIVE  LEUKOCYTESUR NEGATIVE      Imaging: Dg Chest 2 View  Result Date: 08/10/2018 CLINICAL DATA:  Confusion EXAM: CHEST - 2 VIEW COMPARISON:  None. FINDINGS: Lungs are under aerated with bibasilar atelectasis. The heart is a accentuated by low volumes. It is likely normal in size. No pneumothorax or pleural effusion. Normal vascularity. IMPRESSION: Bibasilar atelectasis. Electronically Signed   By: Marybelle Killings M.D.   On: 08/10/2018 08:48   Ct Head Wo Contrast  Result Date: 08/10/2018 CLINICAL DATA:  Encephalopathy EXAM: CT HEAD WITHOUT CONTRAST TECHNIQUE: Contiguous axial images were obtained from the base of the skull through the vertex without intravenous contrast. COMPARISON:  11/21/2017 FINDINGS: Brain: Stable old lacunar infarct in the left internal capsule. No acute intracranial abnormality. Specifically, no hemorrhage, hydrocephalus, mass lesion, acute infarction, or significant intracranial injury. Vascular: No hyperdense vessel or unexpected calcification. Skull: No acute calvarial abnormality. Sinuses/Orbits: Mucosal thickening and air-fluid level in the left maxillary sinus. Other: None IMPRESSION: No acute intracranial abnormality. Stable old left internal capsule lacunar infarct. Acute on chronic left  maxillary sinusitis. Electronically Signed   By: Rolm Baptise M.D.   On: 08/10/2018 09:57      Assessment & Plan: Ms. CHENITA RUDA is a 68 y.o. white female with hypertension, DVT, depression, osteoarthritis, who was admitted to [facility] on 08/10/2018 for weakness . Found to have hyponatremia, hypokalemia and hypomagnesemia.  1. Hyponatremia 2. Hypokalemia 3. Hypomagnesemia 4. Metabolic encephalopathy 5. Hypertension  Plan Continue sodium chloride infusion with potassium.  Completed magnesium replacement.      LOS: 0 Kelbie Moro 3/2/20203:50 PM

## 2018-08-10 NOTE — H&P (Signed)
[facility] [place] NAME: Diana Todd    MR#:  [id number]  DATE OF BIRTH:  1950-12-16  DATE OF ADMISSION:  08/10/2018  PRIMARY CARE PHYSICIAN: Dr Cathlean Sauer  REQUESTING/REFERRING PHYSICIAN: Dr Aundria Rud  CHIEF COMPLAINT:   Chief Complaint  Patient presents with  . Facial Pain  . Altered Mental Status    HISTORY OF PRESENT ILLNESS:  Diana Todd  is a 68 y.o. female with a recently diagnosed DVT.  She has been complaining of feeling tired.  On Valentine's Day she was given medications for sinus infection but did not take them at that time.  She stopped going to work last week.  She is not been eating or drinking very well.  She lives alone and is usually independent.  Her daughter took her over her house last night and the patient woke up altered this morning.  She was able to get her into the car and bring her to the emergency room.  Of note in August all of her upper teeth were removed.  She has had a 50 pound weight loss since November.  She started amoxicillin on Friday for sinusitis.  The ER physician needed to give her 6 mg of Ativan to get her on the CAT scan machine.  Currently she is altered and unable to provide any review of systems or any history.  History obtained from daughter at the bedside.  Sodium was found to be 125 in the emergency room and potassium 2.8 magnesium 1.7.  Her white count was high at 17.1.  PAST MEDICAL HISTORY:   Past Medical History:  Diagnosis Date  . Anxiety   . DVT (deep venous thrombosis) ([place])   . Hypertension     PAST SURGICAL HISTORY:   Past Surgical History:  Procedure Laterality Date  . NO PAST SURGERIES      SOCIAL HISTORY:   Social History   Tobacco Use  . Smoking status: Never Smoker  . Smokeless tobacco: Never Used  Substance Use Topics  . Alcohol use: Never    Frequency: Never    FAMILY HISTORY:   Family History  Problem Relation Age of Onset  .  Breast cancer Mother   . AAA (abdominal aortic aneurysm) Father     DRUG ALLERGIES:  No known drug allergies  REVIEW OF SYSTEMS:  Unable to provide review of systems because of altered mental status  MEDICATIONS AT HOME:   Prior to Admission medications   Not on File   Medication reconciliation undergoing.  As per daughter patient on hypertension medications depression medications and Eliquis for blood thinner  VITAL SIGNS:  Blood pressure 139/76, pulse 95, temperature (!) 97.4 F (36.3 C), temperature source Oral, resp. rate 19, height 5\' 3"  (1.6 m), weight 103.9 kg, SpO2 98 %.  PHYSICAL EXAMINATION:  GENERAL:  68 y.o.-year-old patient lying in the bed with no acute distress.  EYES: Pupils are pinpoint HEENT: Head atraumatic, normocephalic.  nasopharynx clear.  In palpating her upper gumline there is no pain.  All of her teeth were removed in August. NECK:  Supple, no jugular venous distention. No thyroid enlargement, no tenderness.  LUNGS: Normal breath sounds bilaterally, no wheezing, rales,rhonchi or crepitation. No use of accessory muscles of respiration.  CARDIOVASCULAR: S1, S2 normal. No murmurs, rubs, or gallops.  ABDOMEN: Soft, nontender, nondistended. Bowel sounds present. No organomegaly or mass.  EXTREMITIES: Trace pedal edema, no cyanosis, or clubbing.  NEUROLOGIC: Unresponsive to sternal rub  PSYCHIATRIC: The patient is unresponsive to sternal rub.  SKIN: No rash, lesion, or ulcer.   LABORATORY PANEL:   CBC Recent Labs  Lab 08/10/18 0803  WBC 17.1*  HGB 13.2  HCT 38.6  PLT 143*   ------------------------------------------------------------------------------------------------------------------  Chemistries  Recent Labs  Lab 08/10/18 0803  NA 125*  K 2.8*  CL 86*  CO2 24  GLUCOSE 119*  BUN 16  CREATININE 0.56  CALCIUM 9.1  MG 1.7  AST 28  ALT 15  ALKPHOS 297*  BILITOT 1.5*    ------------------------------------------------------------------------------------------------------------------    RADIOLOGY:  Dg Chest 2 View  Result Date: 08/10/2018 CLINICAL DATA:  Confusion EXAM: CHEST - 2 VIEW COMPARISON:  None. FINDINGS: Lungs are under aerated with bibasilar atelectasis. The heart is a accentuated by low volumes. It is likely normal in size. No pneumothorax or pleural effusion. Normal vascularity. IMPRESSION: Bibasilar atelectasis. Electronically Signed   By: Marybelle Killings M.D.   On: 08/10/2018 08:48   Ct Head Wo Contrast  Result Date: 08/10/2018 CLINICAL DATA:  Encephalopathy EXAM: CT HEAD WITHOUT CONTRAST TECHNIQUE: Contiguous axial images were obtained from the base of the skull through the vertex without intravenous contrast. COMPARISON:  11/21/2017 FINDINGS: Brain: Stable old lacunar infarct in the left internal capsule. No acute intracranial abnormality. Specifically, no hemorrhage, hydrocephalus, mass lesion, acute infarction, or significant intracranial injury. Vascular: No hyperdense vessel or unexpected calcification. Skull: No acute calvarial abnormality. Sinuses/Orbits: Mucosal thickening and air-fluid level in the left maxillary sinus. Other: None IMPRESSION: No acute intracranial abnormality. Stable old left internal capsule lacunar infarct. Acute on chronic left maxillary sinusitis. Electronically Signed   By: Rolm Baptise M.D.   On: 08/10/2018 09:57    EKG:   Normal sinus rhythm 94 bpm, LVH  IMPRESSION AND PLAN:   1.  Severe hyponatremia with altered mental status.  Likely from dehydration.  Recheck a BMP at 2 PM.  Case discussed with nephrology.  Send off a urine osmolarity and urine sodium.  2.  Hypomagnesemia and hypokalemia.  Replace potassium and IV fluids and IV magnesium.  Recheck potassium in a few hours and magnesium tomorrow morning 3.  Acute metabolic encephalopathy likely secondary to hyponatremia and 6 mg Ativan given in the emergency  room.  Continue to monitor closely. 4.  Sinusitis with leukocytosis.  Start Rocephin.  Obtain blood cultures x2.  Doubt this is endocarditis with her teeth removed back in August. 5.  DVT.  If patient does not wake up a little later in the day will have to give heparin drip but hopefully we can give the Eliquis. 6.  Hypertension.  Blood pressure currently stable 7.  Depression.  Because of the low sodium I may have to decrease the dose of the Paxil.   All the records are reviewed and case discussed with ED provider. Management plans discussed with the daughter at the bedside and she in agreement.  CODE STATUS: Full code  TOTAL TIME TAKING CARE OF THIS PATIENT: 50 minutes, including ACP time.    Loletha Grayer M.D on 08/10/2018 at 11:08 AM  Between 7am to 6pm - Pager - [phone]  After 6pm call admission pager [phone]  [facility] Office  [phone]  CC: Primary care physician; Dr. Cathlean Sauer

## 2018-08-10 NOTE — ED Notes (Signed)
Sitter with pt    Family with pt.   nsr on monitor.  Pt waiting on admission.

## 2018-08-10 NOTE — ED Notes (Signed)
Pillow placed under pt's right hip

## 2018-08-10 NOTE — ED Notes (Signed)
FSBS 93

## 2018-08-10 NOTE — ED Notes (Signed)
Pt moving all extremities in bed, groaning. Eyes opened once, briefly. Pt appears more comfortable after being sat up in the bed.

## 2018-08-10 NOTE — ED Notes (Signed)
Pt opened both eyes at this time to being sat up in bed- no verbal response, eyes back closed

## 2018-08-10 NOTE — ED Notes (Signed)
Pt became agitated and trying to take gown up and move around in bed-pt opened eyes and mumbled a few words Pt repositioned in bed and sat up Pt resting comfortably at this time

## 2018-08-10 NOTE — Sedation Documentation (Signed)
This RN, Karl Bales, RN, Kathlee Nations, RN, Dr. Kerry Hough, NP Student, [place], CT Tech, [place], [place] tech in CT 2.

## 2018-08-10 NOTE — ED Notes (Signed)
Report called to megan rn floor nurse 

## 2018-08-10 NOTE — ED Notes (Signed)
Resumed care from paige rn.  Pt drowsy and disoreinted.   Family with pt.  Sinus tach on monitor. Pt moving about on stretcher.

## 2018-08-10 NOTE — Sedation Documentation (Signed)
Family updated as to patient's status.

## 2018-08-10 NOTE — Progress Notes (Signed)
Patient ID: Diana Todd, female   DOB: 10/07/1950, 68 y.o.   MRN: [id number]  ACP note  Patient unable to participate in this conversation.  Daughter at the bedside.  Diagnosis severe hyponatremia with acute encephalopathy, electrolyte abnormalities.  CODE STATUS discussed and patient is a full code.  The patient has been recently diagnosed with a blood clot and started on anticoagulation.  She has not been feeling well for a while.  Recently diagnosed with a sinus infection but just started antibiotics on Friday.  She has not been eating or drinking very well.  She was found to have a very low sodium.  With severe hyponatremia and altered mental status we need to watch the patient closely so CODE STATUS was discussed.  Time spent on ACP discussion 17 minutes Dr. Loletha Grayer

## 2018-08-10 NOTE — ED Notes (Signed)
Pt is sleeping at this time.

## 2018-08-10 NOTE — ED Triage Notes (Signed)
Pt daughter reports pt was supposed to be taking medication for a sinus infection and she started on Friday but has became confused. Pt daughter reports unsure of last known well but they found her confused this am.

## 2018-08-10 NOTE — Sedation Documentation (Signed)
Pt back to baseline prior to ketamine administration. Pt somewhat sedated prior to ketamine administration due to 6mg  IV ativan. Pt noted to move all 4 extremities voluntarily, respirations even and unlabored. VSS on 2L via [place] at this time. Pt's daughter remaisn at bedside at this time. Will continue to monitor for further patient needs and await admission orders.

## 2018-08-10 NOTE — ED Notes (Signed)
ED TO INPATIENT HANDOFF REPORT  ED Nurse Name and Phone #:  Brolin Dambrosia [phone]  S Name/Age/Gender Diana Todd 68 y.o. female Room/Bed: ED04A/ED04A  Code Status   Code Status: Full Code      Home/SNF/OtherHome   Patient oriented to: self Is this baseline? No   Triage Complete: Triage complete  Chief Complaint weakness  Triage Note Pt daughter reports pt was supposed to be taking medication for a sinus infection and she started on Friday but has became confused. Pt daughter reports unsure of last known well but they found her confused this am.    Allergies No Known Allergies  Level of Care/Admitting Diagnosis ED Disposition    ED Disposition Condition [place]: [place] [[id number]]  Level of Care: Med-Surg [16]  Diagnosis: Hyponatremia [751025]  Admitting Physician: Loletha Grayer [[id number]]  Attending Physician: Loletha Grayer [phone]  Estimated length of stay: past midnight tomorrow  Certification:: I certify this patient will need inpatient services for at least 2 midnights  PT Class (Do Not Modify): Inpatient [101]  PT Acc Code (Do Not Modify): Private [1]       B Medical/Surgery History Past Medical History:  Diagnosis Date  . Anxiety   . DVT (deep venous thrombosis) ([place])   . Hypertension    Past Surgical History:  Procedure Laterality Date  . NO PAST SURGERIES       A IV Location/Drains/Wounds Patient Lines/Drains/Airways Status   Active Line/Drains/Airways    Name:   Placement date:   Placement time:   Site:   Days:   Peripheral IV 08/10/18 Right Antecubital   08/10/18    0827    Antecubital   less than 1   Peripheral IV 08/10/18 Anterior Antecubital   08/10/18    1130    Antecubital   less than 1   Peripheral IV 08/10/18 Left Antecubital   08/10/18    1139    Antecubital   less than 1          Intake/Output Last 24 hours No intake or output data in the 24 hours ending 08/10/18  1707  Labs/Imaging Results for orders placed or performed during the hospital encounter of 08/10/18 (from the past 48 hour(s))  Glucose, capillary     Status: None   Collection Time: 08/10/18  7:55 AM  Result Value Ref Range   Glucose-Capillary 93 70 - 99 mg/dL  Comprehensive metabolic panel     Status: Abnormal   Collection Time: 08/10/18  8:03 AM  Result Value Ref Range   Sodium 125 (L) 135 - 145 mmol/L   Potassium 2.8 (L) 3.5 - 5.1 mmol/L   Chloride 86 (L) 98 - 111 mmol/L   CO2 24 22 - 32 mmol/L   Glucose, Bld 119 (H) 70 - 99 mg/dL   BUN 16 8 - 23 mg/dL   Creatinine, Ser 0.56 0.44 - 1.00 mg/dL   Calcium 9.1 8.9 - 10.3 mg/dL   Total Protein 7.4 6.5 - 8.1 g/dL   Albumin 3.4 (L) 3.5 - 5.0 g/dL   AST 28 15 - 41 U/L   ALT 15 0 - 44 U/L   Alkaline Phosphatase 297 (H) 38 - 126 U/L   Total Bilirubin 1.5 (H) 0.3 - 1.2 mg/dL   GFR calc non Af Amer >60 >60 mL/min   GFR calc Af Amer >60 >60 mL/min   Anion gap 15 5 - 15    Comment: Performed at [facility], [address] [place], [place] [address]  CBC     Status: Abnormal   Collection Time: 08/10/18  8:03 AM  Result Value Ref Range   WBC 17.1 (H) 4.0 - 10.5 K/uL   RBC 4.65 3.87 - 5.11 MIL/uL   Hemoglobin 13.2 12.0 - 15.0 g/dL   HCT 38.6 36.0 - 46.0 %   MCV 83.0 80.0 - 100.0 fL   MCH 28.4 26.0 - 34.0 pg   MCHC 34.2 30.0 - 36.0 g/dL   RDW 13.7 11.5 - 15.5 %   Platelets 143 (L) 150 - 400 K/uL   nRBC 0.0 0.0 - 0.2 %    Comment: Performed at [facility], [address] [place], [place] [phone]  Magnesium     Status: None   Collection Time: 08/10/18  8:03 AM  Result Value Ref Range   Magnesium 1.7 1.7 - 2.4 mg/dL    Comment: Performed at [facility], [address] [place], [place] [address]  Urinalysis, Complete w Microscopic     Status: Abnormal   Collection Time: 08/10/18 10:20 AM  Result Value Ref Range   Color, Urine AMBER (A) YELLOW    Comment: BIOCHEMICALS MAY BE AFFECTED BY COLOR    APPearance CLEAR (A) CLEAR   Specific Gravity, Urine 1.026 1.005 - 1.030   pH 6.0 5.0 - 8.0   Glucose, UA NEGATIVE NEGATIVE mg/dL   Hgb urine dipstick MODERATE (A) NEGATIVE   Bilirubin Urine NEGATIVE NEGATIVE   Ketones, ur 80 (A) NEGATIVE mg/dL   Protein, ur 100 (A) NEGATIVE mg/dL   Nitrite NEGATIVE NEGATIVE   Leukocytes,Ua NEGATIVE NEGATIVE   RBC / HPF 21-50 0 - 5 RBC/hpf   WBC, UA 0-5 0 - 5 WBC/hpf   Bacteria, UA NONE SEEN NONE SEEN   Squamous Epithelial / LPF 0-5 0 - 5   Mucus PRESENT     Comment: Performed at [facility], [address] [place], [place] [address]  Sodium, urine, random     Status: None   Collection Time: 08/10/18 10:20 AM  Result Value Ref Range   Sodium, Ur <10 mmol/L    Comment: Performed at [facility], [address] [place], [place] [address]  Osmolality, urine     Status: None   Collection Time: 08/10/18 10:20 AM  Result Value Ref Range   Osmolality, Ur 648 300 - 900 mOsm/kg    Comment: Performed at [facility], [address] [place], [place] [address]  Glucose, capillary     Status: None   Collection Time: 08/10/18 12:31 PM  Result Value Ref Range   Glucose-Capillary 87 70 - 99 mg/dL  Basic metabolic panel     Status: Abnormal   Collection Time: 08/10/18  1:27 PM  Result Value Ref Range   Sodium 129 (L) 135 - 145 mmol/L   Potassium 3.5 3.5 - 5.1 mmol/L   Chloride 92 (L) 98 - 111 mmol/L   CO2 25 22 - 32 mmol/L   Glucose, Bld 99 70 - 99 mg/dL   BUN 14 8 - 23 mg/dL   Creatinine, Ser 0.55 0.44 - 1.00 mg/dL   Calcium 8.0 (L) 8.9 - 10.3 mg/dL   GFR calc non Af Amer >60 >60 mL/min   GFR calc Af Amer >60 >60 mL/min   Anion gap 12 5 - 15    Comment: Performed at [facility], [address] [place], [place] [address]   Dg Chest 2  View  Result Date: 08/10/2018 CLINICAL DATA:  Confusion EXAM: CHEST - 2 VIEW COMPARISON:  None. FINDINGS: Lungs are under aerated with bibasilar atelectasis. The heart is a  accentuated by low volumes. It is likely normal in size. No pneumothorax or pleural effusion. Normal vascularity. IMPRESSION: Bibasilar atelectasis. Electronically Signed   By: Marybelle Killings M.D.   On: 08/10/2018 08:48   Ct Head Wo Contrast  Result Date: 08/10/2018 CLINICAL DATA:  Encephalopathy EXAM: CT HEAD WITHOUT CONTRAST TECHNIQUE: Contiguous axial images were obtained from the base of the skull through the vertex without intravenous contrast. COMPARISON:  11/21/2017 FINDINGS: Brain: Stable old lacunar infarct in the left internal capsule. No acute intracranial abnormality. Specifically, no hemorrhage, hydrocephalus, mass lesion, acute infarction, or significant intracranial injury. Vascular: No hyperdense vessel or unexpected calcification. Skull: No acute calvarial abnormality. Sinuses/Orbits: Mucosal thickening and air-fluid level in the left maxillary sinus. Other: None IMPRESSION: No acute intracranial abnormality. Stable old left internal capsule lacunar infarct. Acute on chronic left maxillary sinusitis. Electronically Signed   By: Rolm Baptise M.D.   On: 08/10/2018 09:57    Pending Labs Unresulted Labs (From admission, onward)    Start     Ordered   08/11/18 1950  Basic metabolic panel  Tomorrow morning,   STAT     08/10/18 1102   08/11/18 0500  CBC  Tomorrow morning,   STAT     08/10/18 1102   08/11/18 0500  Magnesium  Tomorrow morning,   STAT     08/10/18 1219   08/10/18 1430  HIV Antibody (routine testing w rflx)  Once,   R     08/10/18 1430   08/10/18 1101  CULTURE, BLOOD (ROUTINE X 2) w Reflex to ID Panel  BLOOD CULTURE X 2,   STAT     08/10/18 1100          Vitals/Pain Today's Vitals   08/10/18 1440 08/10/18 1500 08/10/18 1620 08/10/18 1644  BP: (!) 167/82 (!) 179/84 (!) 168/86 129/75  Pulse: (!) 102 (!) 103 (!) 104   Resp: (!) 24 (!) 23 20 18   Temp:      TempSrc:      SpO2: 99% 100% 100%   Weight:      Height:      PainSc:        Isolation Precautions No  active isolations  Medications Medications  ketamine (KETALAR) injection 104 mg (104 mg Intravenous See Procedure Record 08/10/18 0948)  0.9 % NaCl with KCl 20 mEq/ L  infusion ( Intravenous New Bag/Given 08/10/18 1149)  cefTRIAXone (ROCEPHIN) 2 g in sodium chloride 0.9 % 100 mL IVPB (0 g Intravenous Stopped 08/10/18 1355)  acetaminophen (TYLENOL) tablet 650 mg (has no administration in time range)    Or  acetaminophen (TYLENOL) suppository 650 mg (has no administration in time range)  ondansetron (ZOFRAN) tablet 4 mg (has no administration in time range)    Or  ondansetron (ZOFRAN) injection 4 mg (has no administration in time range)  PARoxetine (PAXIL) tablet 20 mg (has no administration in time range)  pantoprazole (PROTONIX) EC tablet 40 mg (has no administration in time range)  fluticasone (FLONASE) 50 MCG/ACT nasal spray 2 spray (2 sprays Each Nare Not Given 08/10/18 1401)  apixaban (ELIQUIS) tablet 5 mg (has no administration in time range)  hydrALAZINE (APRESOLINE) injection 5 mg (has no administration in time range)  lisinopril (PRINIVIL,ZESTRIL) tablet 40 mg (40 mg Oral Not Given 08/10/18 1522)  haloperidol lactate (HALDOL) injection  1 mg (has no administration in time range)  sodium chloride 0.9 % bolus 1,000 mL (0 mLs Intravenous Stopped 08/10/18 1330)  LORazepam (ATIVAN) injection 1 mg (1 mg Intravenous Given 08/10/18 0854)  LORazepam (ATIVAN) injection 1 mg (1 mg Intravenous Given 08/10/18 0850)  LORazepam (ATIVAN) injection 2 mg (2 mg Intravenous Given 08/10/18 0926)  LORazepam (ATIVAN) injection 2 mg (2 mg Intravenous Given 08/10/18 0901)  ketamine (KETALAR) injection (104 mg Intravenous Given 08/10/18 0948)  magnesium sulfate IVPB 2 g 50 mL (0 g Intravenous Stopped 08/10/18 1325)    Mobility non-ambulatory Low fall risk   Focused Assessments Cardiac Assessment Handoff:  Cardiac Rhythm: Sinus tachycardia No results found for: CKTOTAL, CKMB, CKMBINDEX, TROPONINI No results found for:  DDIMER Does the Patient currently have chest pain? No      R Recommendations: See Admitting Provider Note  Report given to:   Additional Notes: pt drowsy and confused from ativan and ketamine earlier today.  Pt needs to be close to nurses station.

## 2018-08-10 NOTE — ED Notes (Signed)
Pt unable to follow verbal commands.  Pt unable to take po meds at this time.

## 2018-08-10 NOTE — ED Notes (Signed)
First Nurse Note: Daughter states patient is dehydrated, denies N&V.  Patient sitting in WC, alert.

## 2018-08-10 NOTE — ED Notes (Signed)
Pt placed on a hospital bed.  Safety sitter with pt now.  Dr wieting aware. Family with pt

## 2018-08-11 ENCOUNTER — Other Ambulatory Visit: Payer: Self-pay

## 2018-08-11 ENCOUNTER — Inpatient Hospital Stay: Payer: PPO

## 2018-08-11 LAB — BASIC METABOLIC PANEL
Anion gap: 13 (ref 5–15)
BUN: 14 mg/dL (ref 8–23)
CHLORIDE: 96 mmol/L — AB (ref 98–111)
CO2: 21 mmol/L — ABNORMAL LOW (ref 22–32)
Calcium: 8.1 mg/dL — ABNORMAL LOW (ref 8.9–10.3)
Creatinine, Ser: 0.42 mg/dL — ABNORMAL LOW (ref 0.44–1.00)
GFR calc Af Amer: >60 mL/min (ref >60)
GFR calc non Af Amer: >60 mL/min (ref >60)
GLUCOSE: 91 mg/dL (ref 70–99)
Potassium: 2.9 mmol/L — ABNORMAL LOW (ref 3.5–5.1)
Sodium: 130 mmol/L — ABNORMAL LOW (ref 135–145)

## 2018-08-11 LAB — CBC
HEMATOCRIT: 33.3 % — AB (ref 36.0–46.0)
Hemoglobin: 11.1 g/dL — ABNORMAL LOW (ref 12.0–15.0)
MCH: 28.3 pg (ref 26.0–34.0)
MCHC: 33.3 g/dL (ref 30.0–36.0)
MCV: 84.9 fL (ref 80.0–100.0)
Platelets: 121 10*3/uL — ABNORMAL LOW (ref 150–400)
RBC: 3.92 MIL/uL (ref 3.87–5.11)
RDW: 13.7 % (ref 11.5–15.5)
WBC: 12.9 10*3/uL — ABNORMAL HIGH (ref 4.0–10.5)
nRBC: 0 % (ref 0.0–0.2)

## 2018-08-11 LAB — AMMONIA: Ammonia: 12 umol/L (ref 9–35)

## 2018-08-11 LAB — MAGNESIUM: Magnesium: 1.9 mg/dL (ref 1.7–2.4)

## 2018-08-11 MED ORDER — ENSURE ENLIVE PO LIQD
237.0000 mL | Freq: Three times a day (TID) | ORAL | Status: DC
  Administered 2018-08-12 – 2018-08-14 (×3): 237 mL via ORAL

## 2018-08-11 MED ORDER — POTASSIUM CHLORIDE 20 MEQ PO PACK
60.0000 meq | PACK | Freq: Once | ORAL | Status: AC
  Administered 2018-08-11: 17:00:00 60 meq via ORAL
  Filled 2018-08-11: qty 3

## 2018-08-11 MED ORDER — SODIUM BICARBONATE 650 MG PO TABS
650.0000 mg | ORAL_TABLET | Freq: Four times a day (QID) | ORAL | Status: AC
  Administered 2018-08-11 (×4): 650 mg via ORAL
  Filled 2018-08-11 (×4): qty 1

## 2018-08-11 MED ORDER — ADULT MULTIVITAMIN W/MINERALS CH
1.0000 | ORAL_TABLET | Freq: Every day | ORAL | Status: DC
  Administered 2018-08-12 – 2018-08-14 (×3): 1 via ORAL
  Filled 2018-08-11 (×3): qty 1

## 2018-08-11 MED ORDER — LORAZEPAM 2 MG/ML IJ SOLN: 1.0000 mg | Freq: Once | INTRAMUSCULAR | Status: DC

## 2018-08-11 NOTE — Progress Notes (Signed)
Responded to consult to check midline. Pt up to [facility].

## 2018-08-11 NOTE — Progress Notes (Signed)
Initial Nutrition Assessment  DOCUMENTATION CODES:   Morbid obesity  INTERVENTION:  Provide Ensure Enlive po TID, each supplement provides 350 kcal and 20 grams of protein.  Provide daily MVI.  NUTRITION DIAGNOSIS:   Inadequate oral intake related to lethargy/confusion as evidenced by meal completion < 50%.  GOAL:   Patient will meet greater than or equal to 90% of their needs  MONITOR:   PO intake, Supplement acceptance, Labs, Weight trends, I & O's  REASON FOR ASSESSMENT:   Malnutrition Screening Tool    ASSESSMENT:   68 year old female with PMHx of anxiety, HTN, hx DVT admitted with acute hyponatremia/hypokalemia/hypochloremia likely related to poor PO intake, acute toxic metabolic encephalopathy, acute sinusitis.   Met with patient at bedside. She was confused and unable to provide a nutrition/weight history. No family members present at time of RD assessment. Patient reports she is eating well today but tray at bedside was untouched. No weight history in chart to trend.  Medications reviewed and include: Eliquis, lisinopril, potassium chloride 60 mEQ PO once today, sodium bicarbonate 650 mg PO x 4 today, NS with KCl 20 mEq/L at 75 mL/hr.  Labs reviewed: Sodium 130, Potassium 2.9, Chloride 96, CO2 21, Creatinine 0.42.  Patient does not meet criteria for malnutrition at this time.  NUTRITION - FOCUSED PHYSICAL EXAM:    Most Recent Value  Orbital Region  No depletion  Upper Arm Region  No depletion  Thoracic and Lumbar Region  No depletion  Buccal Region  No depletion  Temple Region  Mild depletion  Clavicle Bone Region  Mild depletion  Clavicle and Acromion Bone Region  Mild depletion  Scapular Bone Region  Unable to assess  Dorsal Hand  No depletion  Patellar Region  No depletion  Anterior Thigh Region  No depletion  Posterior Calf Region  No depletion  Edema (RD Assessment)  None  Hair  Reviewed  Eyes  Reviewed  Mouth  Unable to assess  Skin  Reviewed   Nails  Reviewed     Diet Order:   Diet Order            Diet Heart Room service appropriate? Yes; Fluid consistency: Thin  Diet effective now             EDUCATION NEEDS:   Not appropriate for education at this time  Skin:  Skin Assessment: Reviewed RN Assessment  Last BM:  Unknown  Height:   Ht Readings from Last 1 Encounters:  08/10/18 '5\' 3"'$  (1.6 m)   Weight:   Wt Readings from Last 1 Encounters:  08/10/18 103.9 kg   Ideal Body Weight:  52.3 kg  BMI:  Body mass index is 40.57 kg/m.  Estimated Nutritional Needs:   Kcal:  1800-2000  Protein:  90-100 grams  Fluid:  1.8-2 L/day  Willey Blade, MS, RD, LDN Office: [phone] Pager: [phone] After Hours/Weekend Pager: [phone]

## 2018-08-11 NOTE — Progress Notes (Signed)
[facility]  ROUNDING NOTE   Subjective:   Daughter at bedside  Na 130 (129)  K 2.9  NS with 20KCl at 39mL/hr  Objective:  Vital signs in last 24 hours:  Temp:  [97.9 F (36.6 C)] 97.9 F (36.6 C) (03/02 2225) Pulse Rate:  [94-114] 94 (03/03 1132) Resp:  [18-25] 20 (03/03 1132) BP: (129-191)/(74-96) 162/74 (03/03 1132) SpO2:  [95 %-100 %] 96 % (03/03 1132)  Weight change:  Filed Weights   08/10/18 0759  Weight: 103.9 kg    Intake/Output: I/O last 3 completed shifts: In: 1335.9 [I.V.:1235.6; IV Piggyback:100.2] Out: 250 [Urine:250]   Intake/Output this shift:  No intake/output data recorded.  Physical Exam: General: NAD, laying in bed  Head: Normocephalic, atraumatic. Dry oral mucosal membranes  Eyes: Anicteric, PERRL  Neck: Supple, trachea midline  Lungs:  Clear to auscultation  Heart: Regular rate and rhythm  Abdomen:  Soft, nontender,   Extremities: no peripheral edema.  Neurologic: Lethargic, slow to respond  Skin: No lesions        Basic Metabolic Panel: Recent Labs  Lab 08/10/18 0803 08/10/18 1327 08/11/18 0505  NA 125* 129* 130*  K 2.8* 3.5 2.9*  CL 86* 92* 96*  CO2 24 25 21*  GLUCOSE 119* 99 91  BUN 16 14 14   CREATININE 0.56 0.55 0.42*  CALCIUM 9.1 8.0* 8.1*  MG 1.7  --  1.9    Liver Function Tests: Recent Labs  Lab 08/10/18 0803  AST 28  ALT 15  ALKPHOS 297*  BILITOT 1.5*  PROT 7.4  ALBUMIN 3.4*   No results for input(s): LIPASE, AMYLASE in the last 168 hours. No results for input(s): AMMONIA in the last 168 hours.  CBC: Recent Labs  Lab 08/10/18 0803 08/11/18 0505  WBC 17.1* 12.9*  HGB 13.2 11.1*  HCT 38.6 33.3*  MCV 83.0 84.9  PLT 143* 121*    Cardiac Enzymes: No results for input(s): CKTOTAL, CKMB, CKMBINDEX, TROPONINI in the last 168 hours.  BNP: Invalid input(s): POCBNP  CBG: Recent Labs  Lab 08/10/18 0755 08/10/18 1231  GLUCAP 93 44    Microbiology: Results for orders placed or  performed during the hospital encounter of 08/10/18  CULTURE, BLOOD (ROUTINE X 2) w Reflex to ID Panel     Status: None (Preliminary result)   Collection Time: 08/10/18 11:33 AM  Result Value Ref Range Status   Specimen Description BLOOD LEFT ANTECUBITAL  Final   Special Requests   Final    BOTTLES DRAWN AEROBIC AND ANAEROBIC Blood Culture results may not be optimal due to an excessive volume of blood received in culture bottles   Culture   Final    NO GROWTH < 24 HOURS Performed at [facility], [address], [place], [place] [address]    Report Status PENDING  Incomplete  CULTURE, BLOOD (ROUTINE X 2) w Reflex to ID Panel     Status: None (Preliminary result)   Collection Time: 08/10/18 11:36 AM  Result Value Ref Range Status   Specimen Description BLOOD RIGHT ANTECUBITAL  Final   Special Requests   Final    BOTTLES DRAWN AEROBIC AND ANAEROBIC Blood Culture adequate volume   Culture   Final    NO GROWTH < 24 HOURS Performed at [facility], [address] [place], [place] [address]    Report Status PENDING  Incomplete    Coagulation Studies: No results for input(s): LABPROT, INR in the last 72 hours.  Urinalysis: Recent  Labs    08/10/18 1020  COLORURINE AMBER*  LABSPEC 1.026  PHURINE 6.0  GLUCOSEU NEGATIVE  HGBUR MODERATE*  BILIRUBINUR NEGATIVE  KETONESUR 80*  PROTEINUR 100*  NITRITE NEGATIVE  LEUKOCYTESUR NEGATIVE      Imaging: Dg Chest 2 View  Result Date: 08/10/2018 CLINICAL DATA:  Confusion EXAM: CHEST - 2 VIEW COMPARISON:  None. FINDINGS: Lungs are under aerated with bibasilar atelectasis. The heart is a accentuated by low volumes. It is likely normal in size. No pneumothorax or pleural effusion. Normal vascularity. IMPRESSION: Bibasilar atelectasis. Electronically Signed   By: Marybelle Killings M.D.   On: 08/10/2018 08:48   Ct Head Wo Contrast  Result Date: 08/10/2018 CLINICAL DATA:  Encephalopathy EXAM: CT HEAD WITHOUT CONTRAST TECHNIQUE:  Contiguous axial images were obtained from the base of the skull through the vertex without intravenous contrast. COMPARISON:  11/21/2017 FINDINGS: Brain: Stable old lacunar infarct in the left internal capsule. No acute intracranial abnormality. Specifically, no hemorrhage, hydrocephalus, mass lesion, acute infarction, or significant intracranial injury. Vascular: No hyperdense vessel or unexpected calcification. Skull: No acute calvarial abnormality. Sinuses/Orbits: Mucosal thickening and air-fluid level in the left maxillary sinus. Other: None IMPRESSION: No acute intracranial abnormality. Stable old left internal capsule lacunar infarct. Acute on chronic left maxillary sinusitis. Electronically Signed   By: Rolm Baptise M.D.   On: 08/10/2018 09:57     Medications:   . 0.9 % NaCl with KCl 20 mEq / L 75 mL/hr at 08/11/18 0600   . apixaban  5 mg Oral BID  . fluticasone  2 spray Each Nare Daily  . ketamine (KETALAR) injection 10mg /mL (IV use)  1 mg/kg Intravenous Once  . lisinopril  40 mg Oral Daily  . potassium chloride  60 mEq Oral Once  . sodium bicarbonate  650 mg Oral QID   acetaminophen **OR** acetaminophen, haloperidol lactate, hydrALAZINE, ondansetron **OR** ondansetron (ZOFRAN) IV, pantoprazole  Assessment/ Plan:  Diana Todd is a 68 y.o. white female with hypertension, DVT, depression, osteoarthritis, who was admitted to [facility] on 08/10/2018 for weakness . Found to have hyponatremia, hypokalemia and hypomagnesemia. Secondary to poor PO intake.   1. Hyponatremia 2. Hypokalemia 3. Hypomagnesemia 4. Metabolic encephalopathy 5. Hypertension  Plan Continue sodium chloride infusion with potassium.  Continue potassium replacement.    LOS: 1 Delron Comer 3/3/202012:42 PM

## 2018-08-11 NOTE — Evaluation (Signed)
Physical Therapy Evaluation Patient Details Name: Diana Todd MRN: [id number] DOB: 30-Mar-1951 Today's Date: 08/11/2018   History of Present Illness  Pt is a 68 y.o. female presenting to hospital 08/10/18 with AMS and facial pain.  Pt admitted with severe hyponatremia with AMS, hypomagnesium, and hypokalemia.  Agitation noted during hospital stay.  PMH includes anxiety, depression, htn, DVT, and reported 50# weight loss since November.  Clinical Impression  Prior to hospital admission, pt was independent with ambulation and working 2 jobs (40 hours per week).  Pt lives alone in 1 level home with steps to enter.  Pt sitting on BSC upon PT arrival and c/o dizziness and needing to get back to bed; pt's daughter present.  Pt able to stand and take steps from [employer] back to bed with min assist.  After pt was safely back in bed, pt's BP taken (156/76 with HR 98 bpm and pt reporting symptoms resolving; nurse present and notified).  Pt would benefit from skilled PT to address noted impairments and functional limitations (see below for any additional details).  Some cognitive confusion noted.  Upon hospital discharge, anticipate with continued improvement in medical status and continued attempts at functional mobility, pt will be able to discharge home with SBA with functional mobility for safety.    Follow Up Recommendations Home health PT;Supervision for mobility/OOB    Equipment Recommendations  Rolling walker with 5" wheels    Recommendations for Other Services OT consult     Precautions / Restrictions Precautions Precautions: Fall Restrictions Weight Bearing Restrictions: No      Mobility  Bed Mobility Overal bed mobility: Needs Assistance Bed Mobility: Sit to Supine       Sit to supine: HOB elevated;Min assist   General bed mobility comments: minimal assist for LE's and trunk sit to supine; vc's for technique  Transfers Overall transfer level: Needs assistance Equipment used:  None Transfers: Sit to/from [facility] Sit to Stand: Min assist Stand pivot transfers: Min assist(stand step turn BSC to bed)       General transfer comment: assist to initiate stand and control descent sitting; vc's for UE/LE placement  Ambulation/Gait             General Gait Details: Deferred d/t pt c/o dizziness sitting on [facility]  Stairs            Wheelchair Mobility    Modified Rankin (Stroke Patients Only)       Balance Overall balance assessment: Needs assistance Sitting-balance support: Feet supported;Single extremity supported Sitting balance-Leahy Scale: Poor Sitting balance - Comments: pt requiring at least single UE support for static sitting balance   Standing balance support: Single extremity supported Standing balance-Leahy Scale: Poor Standing balance comment: pt requiring at least single UE support for static standing balance                             Pertinent Vitals/Pain Pain Assessment: No/denies pain  O2 sats WFL during session.    Home Living Family/patient expects to be discharged to:: Private residence Living Arrangements: Alone Available Help at Discharge: Family Type of Home: House Home Access: Stairs to enter Entrance Stairs-Rails: None Entrance Stairs-Number of Steps: 1-2 Home Layout: One level Home Equipment: None      Prior Function Level of Independence: Independent         Comments: Pt was working 2 jobs (40 hours per week).     Hand Dominance  Extremity/Trunk Assessment   Upper Extremity Assessment Upper Extremity Assessment: Difficult to assess due to impaired cognition    Lower Extremity Assessment Lower Extremity Assessment: Difficult to assess due to impaired cognition(appearing grossly at least 3/5 AROM with mobility)    Cervical / Trunk Assessment Cervical / Trunk Assessment: Normal  Communication   Communication: (Increased time to respond noted at times)   Cognition Arousal/Alertness: (Pt appearing drowsy) Behavior During Therapy: Flat affect Overall Cognitive Status: Impaired/Different from baseline Area of Impairment: Orientation                 Orientation Level: (Oriented to name and part of DOB; oriented to hospital but not reason she is here; oriented to date)                    General Comments   Nursing cleared pt for participation in physical therapy.  Pt agreeable to limited PT session.  Pt's daughter present during session.    Exercises     Assessment/Plan    PT Assessment Patient needs continued PT services  PT Problem List Decreased strength;Decreased activity tolerance;Decreased balance;Decreased mobility;Decreased cognition;Decreased knowledge of use of DME;Decreased knowledge of precautions;Decreased safety awareness       PT Treatment Interventions DME instruction;Gait training;Stair training;Functional mobility training;Therapeutic activities;Therapeutic exercise;Balance training;Patient/family education    PT Goals (Current goals can be found in the Care Plan section)  Acute Rehab PT Goals Patient Stated Goal: to improve mobility PT Goal Formulation: With patient/family Time For Goal Achievement: 08/25/18 Potential to Achieve Goals: Good    Frequency Min 2X/week   Barriers to discharge Decreased caregiver support      Co-evaluation               AM-PAC PT "6 Clicks" Mobility  Outcome Measure Help needed turning from your back to your side while in a flat bed without using bedrails?: A Little Help needed moving from lying on your back to sitting on the side of a flat bed without using bedrails?: A Little Help needed moving to and from a bed to a chair (including a wheelchair)?: A Little Help needed standing up from a chair using your arms (e.g., wheelchair or bedside chair)?: A Little Help needed to walk in hospital room?: A Little Help needed climbing 3-5 steps with a railing? : A  Little 6 Click Score: 18    End of Session Equipment Utilized During Treatment: Gait belt Activity Tolerance: Other (comment)(Limited d/t c/o dizziness) Patient left: in bed;with call bell/phone within reach;with bed alarm set;with family/visitor present Nurse Communication: Mobility status;Precautions;Other (comment)(pt's c/o dizziness and pt's BP) PT Visit Diagnosis: Other abnormalities of gait and mobility (R26.89);Unsteadiness on feet (R26.81);Muscle weakness (generalized) (M62.81)    Time: 2778-2423 PT Time Calculation (min) (ACUTE ONLY): 16 min   Charges:   PT Evaluation $PT Eval Low Complexity: 1 Low         Callahan, PT 08/11/18, 2:08 PM [phone]

## 2018-08-11 NOTE — Progress Notes (Signed)
[place] [facility] [place] NAME: Diana Todd    MR#:  [id number]  DATE OF BIRTH:  Jun 02, 1951  SUBJECTIVE:  CHIEF COMPLAINT:   Patient remains lethargic, daughter at the bedside, patient is able to answer questions  REVIEW OF SYSTEMS:  CONSTITUTIONAL: No fever, fatigue or weakness.  EYES: No blurred or double vision.  EARS, NOSE, AND THROAT: No tinnitus or ear pain.  RESPIRATORY: No cough, shortness of breath, wheezing or hemoptysis.  CARDIOVASCULAR: No chest pain, orthopnea, edema.  GASTROINTESTINAL: No nausea, vomiting, diarrhea or abdominal pain.  GENITOURINARY: No dysuria, hematuria.  ENDOCRINE: No polyuria, nocturia,  HEMATOLOGY: No anemia, easy bruising or bleeding SKIN: No rash or lesion. MUSCULOSKELETAL: No joint pain or arthritis.   NEUROLOGIC: No tingling, numbness, weakness.  PSYCHIATRY: No anxiety or depression.   ROS  DRUG ALLERGIES:  No Known Allergies  VITALS:  Blood pressure (!) 162/74, pulse 94, temperature 97.9 F (36.6 C), resp. rate 20, height 5\' 3"  (1.6 m), weight 103.9 kg, SpO2 96 %.  PHYSICAL EXAMINATION:  GENERAL:  68 y.o.-year-old patient lying in the bed with no acute distress.  EYES: Pupils equal, round, reactive to light and accommodation. No scleral icterus. Extraocular muscles intact.  HEENT: Head atraumatic, normocephalic. Oropharynx and nasopharynx clear.  NECK:  Supple, no jugular venous distention. No thyroid enlargement, no tenderness.  LUNGS: Normal breath sounds bilaterally, no wheezing, rales,rhonchi or crepitation. No use of accessory muscles of respiration.  CARDIOVASCULAR: S1, S2 normal. No murmurs, rubs, or gallops.  ABDOMEN: Soft, nontender, nondistended. Bowel sounds present. No organomegaly or mass.  EXTREMITIES: No pedal edema, cyanosis, or clubbing.  NEUROLOGIC: Cranial nerves II through XII are intact. Muscle strength 5/5 in all extremities. Sensation intact. Gait not checked.   PSYCHIATRIC: The patient is alert and oriented x 3.  SKIN: No obvious rash, lesion, or ulcer.   Physical Exam LABORATORY PANEL:   CBC Recent Labs  Lab 08/11/18 0505  WBC 12.9*  HGB 11.1*  HCT 33.3*  PLT 121*   ------------------------------------------------------------------------------------------------------------------  Chemistries  Recent Labs  Lab 08/10/18 0803  08/11/18 0505  NA 125*   < > 130*  K 2.8*   < > 2.9*  CL 86*   < > 96*  CO2 24   < > 21*  GLUCOSE 119*   < > 91  BUN 16   < > 14  CREATININE 0.56   < > 0.42*  CALCIUM 9.1   < > 8.1*  MG 1.7  --  1.9  AST 28  --   --   ALT 15  --   --   ALKPHOS 297*  --   --   BILITOT 1.5*  --   --    < > = values in this interval not displayed.   ------------------------------------------------------------------------------------------------------------------  Cardiac Enzymes No results for input(s): TROPONINI in the last 168 hours. ------------------------------------------------------------------------------------------------------------------  RADIOLOGY:  Dg Chest 2 View  Result Date: 08/10/2018 CLINICAL DATA:  Confusion EXAM: CHEST - 2 VIEW COMPARISON:  None. FINDINGS: Lungs are under aerated with bibasilar atelectasis. The heart is a accentuated by low volumes. It is likely normal in size. No pneumothorax or pleural effusion. Normal vascularity. IMPRESSION: Bibasilar atelectasis. Electronically Signed   By: Marybelle Killings M.D.   On: 08/10/2018 08:48   Ct Head Wo Contrast  Result Date: 08/10/2018 CLINICAL DATA:  Encephalopathy EXAM: CT HEAD WITHOUT CONTRAST TECHNIQUE: Contiguous axial images were obtained from the base of the skull through  the vertex without intravenous contrast. COMPARISON:  11/21/2017 FINDINGS: Brain: Stable old lacunar infarct in the left internal capsule. No acute intracranial abnormality. Specifically, no hemorrhage, hydrocephalus, mass lesion, acute infarction, or significant intracranial  injury. Vascular: No hyperdense vessel or unexpected calcification. Skull: No acute calvarial abnormality. Sinuses/Orbits: Mucosal thickening and air-fluid level in the left maxillary sinus. Other: None IMPRESSION: No acute intracranial abnormality. Stable old left internal capsule lacunar infarct. Acute on chronic left maxillary sinusitis. Electronically Signed   By: Rolm Baptise M.D.   On: 08/10/2018 09:57    ASSESSMENT AND PLAN:  *Acute hyponatremia/hypokalemia/hypochloremia  Most likely due to poor p.o. intake Replete with IV fluids for rehydration, p.o. potassium, p.o. bicarb, check BMP in the morning   *Acute toxic metabolic encephalopathy Suspected due to medication side effect Hold sedating agents, check ammonia level, RPR, MRI of the brain for further evaluation, aspiration/fall/skin care precautions while in house  *Acute sinusitis  Resolving  Augmentin for 3 to 5-day course   *Chronic hypertension  Stable  Continue home regiment   *Depression  Paxil decreased given encephalopathy  Disposition Home in 1 to 2 days barring any complications   All the records are reviewed and case discussed with Care Management/Social Workerr. Management plans discussed with the patient, family and they are in agreement.  CODE STATUS: full  TOTAL TIME TAKING CARE OF THIS PATIENT: 40 minutes.     POSSIBLE D/C IN 1-2 DAYS, DEPENDING ON CLINICAL CONDITION.   Avel Peace Jondavid Schreier M.D on 08/11/2018   Between 7am to 6pm - Pager - [phone]  After 6pm go to [email or web address] - password [email or web address] [facility]  Office  [phone]  CC: Primary care physician; System, Provider Not In  Note: This dictation was prepared with Dragon dictation along with smaller phrase technology. Any transcriptional errors that result from this process are unintentional.

## 2018-08-12 ENCOUNTER — Inpatient Hospital Stay: Payer: PPO

## 2018-08-12 ENCOUNTER — Other Ambulatory Visit: Payer: PPO

## 2018-08-12 ENCOUNTER — Inpatient Hospital Stay (HOSPITAL_COMMUNITY)
Admit: 2018-08-12 | Discharge: 2018-08-12 | Disposition: A | Discharge status: Home / Self Care | Payer: PPO | Attending: Nurse Practitioner | Admitting: Nurse Practitioner

## 2018-08-12 DIAGNOSIS — I6350 Cerebral infarction due to unspecified occlusion or stenosis of unspecified cerebral artery: Secondary | ICD-10-CM

## 2018-08-12 DIAGNOSIS — F331 Major depressive disorder, recurrent, moderate: Secondary | ICD-10-CM

## 2018-08-12 LAB — BASIC METABOLIC PANEL
Anion gap: 9 (ref 5–15)
BUN: 12 mg/dL (ref 8–23)
CO2: 23 mmol/L (ref 22–32)
Calcium: 8.1 mg/dL — ABNORMAL LOW (ref 8.9–10.3)
Chloride: 102 mmol/L (ref 98–111)
Creatinine, Ser: 0.45 mg/dL (ref 0.44–1.00)
GFR calc Af Amer: >60 mL/min (ref >60)
GFR calc non Af Amer: >60 mL/min (ref >60)
Glucose, Bld: 110 mg/dL — ABNORMAL HIGH (ref 70–99)
Potassium: 3.7 mmol/L (ref 3.5–5.1)
Sodium: 134 mmol/L — ABNORMAL LOW (ref 135–145)

## 2018-08-12 LAB — LIPID PANEL
Cholesterol: 140 mg/dL (ref 0–200)
HDL: 43 mg/dL (ref >40)
LDL Cholesterol: 76 mg/dL (ref 0–99)
Total CHOL/HDL Ratio: 3.3 RATIO
Triglycerides: 107 mg/dL (ref <150)
VLDL: 21 mg/dL (ref 0–40)

## 2018-08-12 LAB — HIV ANTIBODY (ROUTINE TESTING W REFLEX): HIV SCREEN 4TH GENERATION: NONREACTIVE

## 2018-08-12 LAB — RPR: RPR Ser Ql: NONREACTIVE

## 2018-08-12 LAB — HEMOGLOBIN A1C
Hgb A1c MFr Bld: 5.8 % — ABNORMAL HIGH (ref 4.8–5.6)
Mean Plasma Glucose: 119.76 mg/dL

## 2018-08-12 LAB — MAGNESIUM
Magnesium: 1.8 mg/dL (ref 1.7–2.4)
Magnesium: 1.7 mg/dL (ref 1.7–2.4)

## 2018-08-12 LAB — POTASSIUM: Potassium: 3.5 mmol/L (ref 3.5–5.1)

## 2018-08-12 MED ORDER — PERFLUTREN LIPID MICROSPHERE
1.0000 mL | INTRAVENOUS | Status: AC | PRN
  Administered 2018-08-12: 2 mL via INTRAVENOUS
  Filled 2018-08-12: qty 10

## 2018-08-12 MED ORDER — AMOXICILLIN-POT CLAVULANATE 875-125 MG PO TABS
1.0000 | ORAL_TABLET | Freq: Two times a day (BID) | ORAL | Status: DC
  Administered 2018-08-12 – 2018-08-14 (×5): 1 via ORAL
  Filled 2018-08-12 (×5): qty 1

## 2018-08-12 MED ORDER — HYDRALAZINE HCL 20 MG/ML IJ SOLN: 10.0000 mg | Freq: Four times a day (QID) | INTRAMUSCULAR | Status: DC | PRN

## 2018-08-12 NOTE — Progress Notes (Addendum)
Tele called  Re 10 and 8 beat runs on pts tele moniter.  Dr mody informed  By smart message and   Will order k and mag level

## 2018-08-12 NOTE — Progress Notes (Signed)
Physical Therapy Treatment Patient Details Name: Diana Todd MRN: [id number] DOB: 03-13-1951 Today's Date: 08/12/2018    History of Present Illness Pt is a 68 y.o. female presenting to hospital 08/10/18 with AMS and facial pain.  Pt admitted with severe hyponatremia with AMS, hypomagnesium, and hypokalemia.  Agitation noted during hospital stay.  PMH includes anxiety, depression, htn, DVT, and reported 50# weight loss since November.    PT Comments    Chart reviewed.  Per MD note MRI shows embolic CVA.  Will adjust PT frequency to 7x per week per protocol.    Pt in bed, daughter in room.  Pt had moved to end of bed and was trying to get out at foot between rail and footboard.  Daughter stating she wants to get up.  Pt assisted back to bed to put rail down to exit safely out of bed.  She was able to get to edge of bed with min guard and min assist to move fully to edge of bed as she demonstrated difficulty following directions at times.  She stood with min a x 2 for safety and was able to walk 50' today with walker and min a x 2.  Daughter was able to bring recliner behind for safety and it was used as pt c/o feeling dizzy with gait and was assisted to sitting.  She stated she felt better once seated and was given a ride back to her room.  She did demonstrate overall decreased safety and poor walker use, position and speed. She remained in recliner after session with chair alarm in place, daughter in room and instructed daughter to make nsg aware when she leaves for safety.  While pt did make progress today with mobility skills, she demonstrates overall decreased safety and mobility.  Original recommendations was for HHPT upon eval.  Since eval, new CVA has been diagnosed.  Will leave recommendations for now and will continue to monitor and adjust as needed.  If improvement in mobility is limited, we may have to adjust recommendations to SNF or possibly CIR if appropriate.  Will see pt again  tomorrow to address.   Follow Up Recommendations  Home health PT;Supervision for mobility/OOB  See above.     Equipment Recommendations       Recommendations for Other Services       Precautions / Restrictions Precautions Precautions: Fall Restrictions Weight Bearing Restrictions: No    Mobility  Bed Mobility Overal bed mobility: Needs Assistance Bed Mobility: Sit to Supine     Supine to sit: Supervision        Transfers Overall transfer level: Needs assistance Equipment used: Rolling walker (2 wheeled) Transfers: Sit to/from Stand Sit to Stand: Min assist;+2 physical assistance            Ambulation/Gait Ambulation/Gait assistance: Min assist;+2 physical assistance;+2 safety/equipment Gait Distance (Feet): 50 Feet Assistive device: Rolling walker (2 wheeled) Gait Pattern/deviations: Step-through pattern     General Gait Details: Poor walker postition, varied speed, decreased safety.  C/O dizziness after 50', relieved with sitting.   Stairs             Wheelchair Mobility    Modified Rankin (Stroke Patients Only)       Balance Overall balance assessment: Needs assistance   Sitting balance-Leahy Scale: Fair     Standing balance support: Bilateral upper extremity supported Standing balance-Leahy Scale: Poor  Cognition Arousal/Alertness: Awake/alert Behavior During Therapy: Restless;Impulsive Overall Cognitive Status: Impaired/Different from baseline                                        Exercises      General Comments        Pertinent Vitals/Pain Pain Assessment: No/denies pain    Home Living                      Prior Function            PT Goals (current goals can now be found in the care plan section) Progress towards PT goals: Progressing toward goals    Frequency    7X/week      PT Plan Frequency needs to be updated    Co-evaluation               AM-PAC PT "6 Clicks" Mobility   Outcome Measure  Help needed turning from your back to your side while in a flat bed without using bedrails?: A Little Help needed moving from lying on your back to sitting on the side of a flat bed without using bedrails?: A Little Help needed moving to and from a bed to a chair (including a wheelchair)?: A Little Help needed standing up from a chair using your arms (e.g., wheelchair or bedside chair)?: A Little Help needed to walk in hospital room?: A Little Help needed climbing 3-5 steps with a railing? : A Little 6 Click Score: 18    End of Session Equipment Utilized During Treatment: Gait belt Activity Tolerance: Patient limited by fatigue;Treatment limited secondary to medical complications (Comment) Patient left: in chair;with call bell/phone within reach;with chair alarm set;with family/visitor present         Time: 1106-1130 PT Time Calculation (min) (ACUTE ONLY): 24 min  Charges:  $Gait Training: 8-22 mins $Therapeutic Activity: 8-22 mins                     Chesley Noon, PTA 08/12/18, 12:53 PM

## 2018-08-12 NOTE — Evaluation (Signed)
Clinical/Bedside Swallow Evaluation Patient Details  Name: Diana Todd MRN: [id number] Date of Birth: 1951/05/15  Today's Date: 08/12/2018 Time: SLP Start Time (ACUTE ONLY): 8841 SLP Stop Time (ACUTE ONLY): 1310 SLP Time Calculation (min) (ACUTE ONLY): 55 min  Past Medical History:  Past Medical History:  Diagnosis Date  . Anxiety   . DVT (deep venous thrombosis) ([place])   . Hypertension    Past Surgical History:  Past Surgical History:  Procedure Laterality Date  . NO PAST SURGERIES     HPI:  Pt is a 68 y.o. female presenting to hospital 08/10/18 with AMS and facial pain.  Pt admitted with severe hyponatremia with AMS, hypomagnesium, and hypokalemia.  Agitation noted during hospital stay.  PMH includes anxiety, depression, htn, DVT, and reported 50# weight loss since November.  Per patient's daughter who provides most of the history, patient was started on Xarelto 3 weeks ago for DVT however she was unable to tolerate it due to nausea and decreased p.o. intake.  Xarelto was therefore switched to Eliquis last Thursday 08/06/2018.  Per patient's daughter, she continued to have poor p.o. intake and was noted to be lethargic and altered.  Patient daughter brought her to her house however yesterday morning patient's condition had worsened therefore she was taken to the ED for further evaluation.  Patient unsure if she was taking her medication correctly.  States she may have been taking it once a day?. Is possible this could be paradoxical embolism, septic emboli given elevated WBC on presentation.  Follow-up MRI of the brain was obtained yesterday and showed numerous subcentimeter foci of reduced diffusion compatible with acute/early subacute infarction throughout the supratentorial brain and cerebellum concerning for embolic etiology.    Assessment / Plan / Recommendation Clinical Impression  Pt appears to present w/ an adequate oropharyngeal phase swallow function w/ no immediate, overt s/s of  aspiration noted w/ thin liquid trials via straw. This was a brief assessment w/ thin liquid trials at bedside; f/u assessment w/ toleration of solids will be done during next 1-2 days - pt has not been eating at home for ~3 weeks per pt/family report, and pt declines po's now - does not desire to eat she stated. Pt is missing all but 4 front lower teeth - the upper dentition was full removed last Fall. She has had problems w/ the dentures fitting and is no longer wearing them to eat per Dtr. There was report pt was eating at Christmas time(softer foods). Pt was given trials of thin liquids sitting in chair. She held the cup and consumed ~4 ozs total via Straw taking her time. No immediate, overt coughing or decline in vocal quality noted; respiratory status remained calm during/post trials. Oral phase was appropriate for bolus management during sip trials; no leakage or residue remained post trials. OM exam appeared grossly [facility] for bolus management and drinking from straw; speech adequate. No unilateral weakness noted. Recommend modifying diet to mech soft diet w/ minced meats, thin liquids; general aspiration precautions; PIlls WHOLE IN PUREE if needed for swallowing. Pt may need tray setup and sitting EOB or in chair during meals. ST services will f/u w/ ongoing assessment/toleration of diet. NSG/Dietician updated.  SLP Visit Diagnosis: Dysphagia, unspecified (R13.10)    Aspiration Risk  (reduced following general aspiration precautions)    Diet Recommendation  Dysphagia level 3(w/ Minced meats, gravy); Thin liquids. Aspiration precautions; support and supervision at meals. Reduce distractions at meals.  Medication Administration: Whole meds with puree(Crush if  needed)    Other  Recommendations Recommended Consults: (Dietician f/u; Neurology f/u) Oral Care Recommendations: Oral care BID;Staff/trained caregiver to provide oral care Other Recommendations: (n/a )   Follow up Recommendations None(TBD)       Frequency and Duration min 2x/week  1 week       Prognosis Prognosis for Safe Diet Advancement: Fair(-Good) Barriers to Reach Goals: Cognitive deficits;Behavior Barriers/Prognosis Comment: pt declines po's - does not desire to eat she stated      Swallow Study   General Date of Onset: 08/10/18 HPI: Pt is a 68 y.o. female presenting to hospital 08/10/18 with AMS and facial pain.  Pt admitted with severe hyponatremia with AMS, hypomagnesium, and hypokalemia.  Agitation noted during hospital stay.  PMH includes anxiety, depression, htn, DVT, and reported 50# weight loss since November.  Per patient's daughter who provides most of the history, patient was started on Xarelto 3 weeks ago for DVT however she was unable to tolerate it due to nausea and decreased p.o. intake.  Xarelto was therefore switched to Eliquis last Thursday 08/06/2018.  Per patient's daughter, she continued to have poor p.o. intake and was noted to be lethargic and altered.  Patient daughter brought her to her house however yesterday morning patient's condition had worsened therefore she was taken to the ED for further evaluation.  Patient unsure if she was taking her medication correctly.  States she may have been taking it once a day?. Is possible this could be paradoxical embolism, septic emboli given elevated WBC on presentation.  Follow-up MRI of the brain was obtained yesterday and showed numerous subcentimeter foci of reduced diffusion compatible with acute/early subacute infarction throughout the supratentorial brain and cerebellum concerning for embolic etiology.  Type of Study: Bedside Swallow Evaluation Previous Swallow Assessment: none reported Diet Prior to this Study: Regular;Thin liquids Temperature Spikes Noted: No(wbc 12.9 declining from admission) Respiratory Status: Room air History of Recent Intubation: No Behavior/Cognition: Alert;Cooperative;Pleasant mood;Distractible;Requires cueing(confusion -  baseline Cognitive status(?)) Oral Cavity Assessment: Within Functional Limits Oral Care Completed by SLP: Recent completion by staff Oral Cavity - Dentition: Missing dentition;Poor condition(4 front lower teeth only) Vision: Functional for self-feeding Self-Feeding Abilities: Able to feed self;Needs set up;Needs assist Patient Positioning: Upright in chair Baseline Vocal Quality: Normal;Low vocal intensity(min) Volitional Cough: Strong Volitional Swallow: Able to elicit    Oral/Motor/Sensory Function Overall Oral Motor/Sensory Function: Within functional limits(appear adequate in ROM/strength during speech/po's)   Ice Chips Ice chips: Within functional limits Presentation: Spoon(fed; 2 trials)   Thin Liquid Thin Liquid: Within functional limits Presentation: Self Fed;Straw(4 ozs ) Other Comments: juice    Nectar Thick Nectar Thick Liquid: Not tested   Honey Thick Honey Thick Liquid: Not tested   Puree Puree: Not tested Other Comments: declined   Solid     Solid: Not tested Other Comments: declined       Orinda Kenner, MS, CCC-SLP Watson,Katherine 08/12/2018,1:23 PM

## 2018-08-12 NOTE — Progress Notes (Signed)
OT Cancellation Note  Patient Details Name: Diana Todd MRN: [id number] DOB: 1950/12/27   Cancelled Treatment:    Reason Eval/Treat Not Completed: Fatigue/lethargy limiting ability to participate. Consult received, chart reviewed. Pt sleeping soundly, briefly opens eyes then falls back asleep. Will re-attempt at later time as pt is able to participate and is medically appropriate.   Jeni Salles, MPH, MS, OTR/L ascom [phone] 08/12/18, 1:41 PM

## 2018-08-12 NOTE — Evaluation (Signed)
Occupational Therapy Evaluation Patient Details Name: Diana Todd MRN: [id number] DOB: 12-02-1950 Today's Date: 08/12/2018    History of Present Illness Pt is a 68 y.o. female presenting to hospital 08/10/18 with AMS and facial pain.  Pt admitted with severe hyponatremia with AMS, hypomagnesium, and hypokalemia.  Agitation noted during hospital stay. MRI+ for acute/early subacute infarction throughout the supratentorial brain and cerebellum. PMH includes anxiety, depression, htn, DVT, and reported 50# weight loss since November.   Clinical Impression   Pt seen for OT evaluation this date. Pt sleeping lightly, easy to wake, and agreeable to working with OT. Pt reports living by herself but has family who could assist. She was independent, working full time at 2 jobs, and denies falls. Pt oriented to self and date, but unclear why she is here and where she is. Pt reports she is here due to "flipping out" based on what her daughter told her and believes she is at "some sort of rehab". Pt follows commands well, although requires some cues, increased initiation and processing and problem solving time. Pt able to sit EOB to complete grooming task with setup and minimal cues for problem solving. HR 115-120 throughout session. Pt reporting mild dizziness, decreasing as session went on. Pt will benefit from skilled, high intensity OT services to maximize return to PLOF given high level independence at baseline as well as minimize falls risk and caregiver burden. Pt eager to return to work. Recommending CIR at this time. PT notified and will assess pt for CIR appropriateness next session.    Follow Up Recommendations  CIR    Equipment Recommendations  (TBD)    Recommendations for Other Services Rehab consult     Precautions / Restrictions Precautions Precautions: Fall Precaution Comments: monitor HR Restrictions Weight Bearing Restrictions: No      Mobility Bed Mobility Overal bed mobility:  Needs Assistance Bed Mobility: Supine to Sit;Sit to Supine     Supine to sit: Supervision Sit to supine: Min guard   General bed mobility comments: increased time to initiate and determine how to get her R foot untangled from the sheets  Transfers Overall transfer level: Needs assistance Equipment used: Rolling walker (2 wheeled) Transfers: Sit to/from Stand;Lateral/Scoot Transfers Sit to Stand: Min assist;+2 physical assistance        Lateral/Scoot Transfers: Min guard      Balance Overall balance assessment: Needs assistance Sitting-balance support: No upper extremity supported;Feet supported Sitting balance-Leahy Scale: Fair     Standing balance support: Bilateral upper extremity supported Standing balance-Leahy Scale: Poor                             ADL either performed or assessed with clinical judgement   ADL Overall ADL's : Needs assistance/impaired Eating/Feeding: Sitting;Set up   Grooming: Sitting;Wash/dry hands;Wash/dry face;Set up;Supervision/safety;Oral care Grooming Details (indicate cue type and reason): cues for opening toothpaste packaging, increased time to initiate and problem solve putting toothpaste on brush while also holding onto her water, requiring cue from OT to set the water down Upper Body Bathing: Sitting;Minimal assistance   Lower Body Bathing: Sit to/from stand;Minimal assistance;Moderate assistance   Upper Body Dressing : Sitting;Set up;Supervision/safety   Lower Body Dressing: Sit to/from stand;Moderate assistance;Minimal assistance Lower Body Dressing Details (indicate cue type and reason): pt able to doff/don socks in bed without assist, in STS to complete dressing over hips requires min-mod A  Vision Baseline Vision/History: Wears glasses Wears Glasses: At all times(pt reports supposed to wear contacts, but currently out) Patient Visual Report: No change from baseline Vision Assessment?: No  apparent visual deficits     Perception     Praxis      Pertinent Vitals/Pain Pain Assessment: No/denies pain     Hand Dominance Right   Extremity/Trunk Assessment Upper Extremity Assessment Upper Extremity Assessment: (denies sensory deficits, BUE strength at least 4/5, coordination appears intact)   Lower Extremity Assessment Lower Extremity Assessment: Defer to PT evaluation(appearing grossly at least 3/5 AROM with mobility)   Cervical / Trunk Assessment Cervical / Trunk Assessment: Normal   Communication Communication Communication: No difficulties   Cognition Arousal/Alertness: Awake/alert Behavior During Therapy: WFL for tasks assessed/performed Overall Cognitive Status: Impaired/Different from baseline Area of Impairment: Orientation;Memory;Safety/judgement;Following commands;Problem solving;Awareness                 Orientation Level: Disoriented to;Place;Situation   Memory: Decreased short-term memory Following Commands: Follows multi-step commands inconsistently Safety/Judgement: Decreased awareness of deficits;Decreased awareness of safety Awareness: Intellectual Problem Solving: Slow processing;Decreased initiation;Difficulty sequencing;Requires verbal cues     General Comments       Exercises     Shoulder Instructions      Home Living Family/patient expects to be discharged to:: Private residence Living Arrangements: Alone Available Help at Discharge: Family Type of Home: House Home Access: Stairs to enter Technical brewer of Steps: 1-2 Entrance Stairs-Rails: None Home Layout: One level     Bathroom Shower/Tub: Teacher, early years/pre: Standard     Home Equipment: None          Prior Functioning/Environment Level of Independence: Independent        Comments: Pt was working 2 jobs (40 hours per week).         OT Problem List: Decreased strength;Decreased knowledge of use of DME or AE;Decreased  cognition;Cardiopulmonary status limiting activity;Decreased safety awareness;Impaired balance (sitting and/or standing)      OT Treatment/Interventions: Self-care/ADL training;Therapeutic exercise;Balance training;Neuromuscular education;Therapeutic activities;DME and/or AE instruction;Patient/family education;Cognitive remediation/compensation    OT Goals(Current goals can be found in the care plan section) Acute Rehab OT Goals Patient Stated Goal: to get better and go home OT Goal Formulation: With patient Time For Goal Achievement: 08/26/18 Potential to Achieve Goals: Good ADL Goals Pt Will Perform Lower Body Dressing: with supervision;sit to/from stand Pt Will Transfer to Toilet: with min guard assist;ambulating;bedside commode(LRAD for amb) Additional ADL Goal #1: Pt will perform standing grooming tasks at sink with CGA and PRN verbal cues for safety or sequencing.  OT Frequency: Min 3X/week   Barriers to D/C:            Co-evaluation              AM-PAC OT "6 Clicks" Daily Activity     Outcome Measure Help from another person eating meals?: None Help from another person taking care of personal grooming?: None Help from another person toileting, which includes using toliet, bedpan, or urinal?: A Lot Help from another person bathing (including washing, rinsing, drying)?: A Lot Help from another person to put on and taking off regular upper body clothing?: A Little Help from another person to put on and taking off regular lower body clothing?: A Lot 6 Click Score: 17   End of Session Nurse Communication: Other (comment)(IV pole beeping)  Activity Tolerance: Patient tolerated treatment well Patient left: in bed;with call bell/phone within reach;with bed alarm set  OT  Visit Diagnosis: Other abnormalities of gait and mobility (R26.89);Muscle weakness (generalized) (M62.81);Other symptoms and signs involving cognitive function                Time: 3267-1245 OT Time  Calculation (min): 27 min Charges:  OT General Charges $OT Visit: 1 Visit OT Evaluation $OT Eval Low Complexity: 1 Low OT Treatments $Self Care/Home Management : 8-22 mins  Jeni Salles, MPH, MS, OTR/L ascom [phone] 08/12/18, 4:15 PM

## 2018-08-12 NOTE — Consult Note (Addendum)
Referring Physician: Bettey Costa, MD    Chief Complaint: Altered mental status  HPI: Diana Todd is an 68 y.o. female with past medical history of hypertension, anxiety and deep vein thrombosis (DVT) of popliteal vein of right lower extremity presenting to the ED 08/10/2018 with altered mental status.  Per patient's daughter who provides most of the history, patient was started on Xarelto 3 weeks ago for DVT however she was unable to tolerate it due to nausea and decreased p.o. intake.  Xarelto was therefore switched to Eliquis last Thursday 08/06/2018.  Per patient's daughter, she continued to have poor p.o. intake and was noted to be lethargic and altered.  Patient daughter brought her to her house however yesterday morning patient's condition had worsened therefore she was taken to the ED for further evaluation.On arrival to the ED, she was afebrile with blood pressure 148/71 mm Hg and pulse rate 86 beats/min. There were no focal neurological deficits; she was alert and but disoriented and unable to follow commands.  Initial labs revealed elevated WBC 17.1, platelets 143, sodium 125, potassium 2.8, albumin 3.4, alkaline phosphate 297, magnesium 1.7, ammonia 12, urinalysis negative for UTI.; an insidious infectious workup was initiated, including rapid HIV, RPR were ultimately negative. ECG showed sinus rhythm of 51 beats per minute and the chest X-ray showed bibasilar atelectasis. A non-contrast head CT showed no acute intracranial abnormality.  Follow-up MRI of the brain was obtained yesterday and showed numerous subcentimeter foci of reduced diffusion compatible with acute/early subacute infarction throughout the supratentorial brain and cerebellum concerning for embolic etiology.  Neurology was therefore consulted for further evaluation and management.  Date last known well: Unable to determine Time last known well: Unable to determine tPA Given: No: On NOAs, unable to determine LKW  Past Medical  History:  Diagnosis Date  . Anxiety   . DVT (deep venous thrombosis) ([place])   . Hypertension     Past Surgical History:  Procedure Laterality Date  . NO PAST SURGERIES      Family History  Problem Relation Age of Onset  . Breast cancer Mother   . AAA (abdominal aortic aneurysm) Father    Social History:  reports that she has never smoked. She has never used smokeless tobacco. She reports that she does not drink alcohol or use drugs.  Allergies: No Known Allergies  Medications:  I have reviewed the patient's current medications. Prior to Admission:  Medications Prior to Admission  Medication Sig Dispense Refill Last Dose  . amoxicillin-clavulanate (AUGMENTIN) 875-125 MG tablet Take 1 tablet by mouth 2 (two) times daily.   08/09/2018 at Unknown time  . apixaban (ELIQUIS) 5 MG TABS tablet Take 5 mg by mouth 2 (two) times daily.   08/09/2018 at 2330  . atenolol (TENORMIN) 50 MG tablet Take 50 mg by mouth daily.   08/09/2018 at 1000  . fluticasone (FLONASE) 50 MCG/ACT nasal spray Place 2 sprays into both nostrils daily.   08/09/2018 at Unknown time  . hydrocortisone 2.5 % cream Apply 1 application topically daily as needed for rash.   Unknown at PRN  . lisinopril (PRINIVIL,ZESTRIL) 40 MG tablet Take 40 mg by mouth at bedtime.   08/09/2018 at 1130  . pantoprazole (PROTONIX) 40 MG tablet Take 40 mg by mouth daily as needed for nausea or heartburn.   Unknown at PRN  . PARoxetine (PAXIL) 30 MG tablet Take 60 mg by mouth daily.   08/09/2018 at 1000   Scheduled: . amoxicillin-clavulanate  1 tablet  Oral Q12H  . apixaban  5 mg Oral BID  . feeding supplement (ENSURE ENLIVE)  237 mL Oral TID BM  . fluticasone  2 spray Each Nare Daily  . ketamine (KETALAR) injection 10mg /mL (IV use)  1 mg/kg Intravenous Once  . lisinopril  40 mg Oral Daily  . LORazepam  1 mg Intravenous Once  . multivitamin with minerals  1 tablet Oral Daily    ROS: Unable to obtain from patient due to altered mental  status  Physical Examination: Blood pressure (!) 168/83, pulse (!) 103, temperature (!) 97.5 F (36.4 C), temperature source Oral, resp. rate 18, height 5\' 3"  (1.6 m), weight 103.9 kg, SpO2 96 %.   HEENT-  Normocephalic, no lesions, without obvious abnormality.  Normal external eye and conjunctiva.  Normal TM's bilaterally.  Normal auditory canals and external ears. Normal external nose, mucus membranes and septum.  Normal pharynx. Cardiovascular- S1, S2 normal, pulses palpable throughout   Lungs- chest clear, no wheezing, rales, normal symmetric air entry Abdomen- soft, non-tender; bowel sounds normal; no masses,  no organomegaly Extremities- no edema Lymph-no adenopathy palpable Musculoskeletal-no joint tenderness, deformity or swelling Skin-warm and dry, no hyperpigmentation, vitiligo, or suspicious lesions  Neurological Exam   Mental Status: Alert, oriented, thought content appropriate.  Speech fluent without evidence of aphasia.  Able to follow 3 step commands without difficulty. Attention span and concentration seemed appropriate  Cranial Nerves: II: Discs flat bilaterally; Visual fields grossly normal, pupils equal, round, reactive to light and accommodation III,IV, VI: ptosis not present, extra-ocular motions intact bilaterally V,VII: smile symmetric, facial light touch sensation intact VIII: hearing normal bilaterally IX,X: gag reflex present XI: bilateral shoulder shrug XII: midline tongue extension Motor: Right :  Upper extremity   5/5 Without pronator drift      Left: Upper extremity   5/5 without pronator drift Right:   Lower extremity   5/5                                          Left: Lower extremity   5/5 Tone and bulk:normal tone throughout; no atrophy noted Sensory: Pinprick and light touch intact bilaterally Deep Tendon Reflexes: 2+ and symmetric throughout Plantars: Right: mute                              Left: mute Cerebellar: Finger-to-nose testing intact  bilaterally. Heel to shin testing normal bilaterally Gait: not tested due to safety concerns  Data Reviewed  Laboratory Studies:  Basic Metabolic Panel: Recent Labs  Lab 08/10/18 0803 08/10/18 1327 08/11/18 0505 08/12/18 0454  NA 125* 129* 130* 134*  K 2.8* 3.5 2.9* 3.7  CL 86* 92* 96* 102  CO2 24 25 21* 23  GLUCOSE 119* 99 91 110*  BUN 16 14 14 12   CREATININE 0.56 0.55 0.42* 0.45  CALCIUM 9.1 8.0* 8.1* 8.1*  MG 1.7  --  1.9 1.8    Liver Function Tests: Recent Labs  Lab 08/10/18 0803  AST 28  ALT 15  ALKPHOS 297*  BILITOT 1.5*  PROT 7.4  ALBUMIN 3.4*   No results for input(s): LIPASE, AMYLASE in the last 168 hours. Recent Labs  Lab 08/11/18 1346  AMMONIA 12    CBC: Recent Labs  Lab 08/10/18 0803 08/11/18 0505  WBC 17.1* 12.9*  HGB 13.2 11.1*  HCT  38.6 33.3*  MCV 83.0 84.9  PLT 143* 121*    Cardiac Enzymes: No results for input(s): CKTOTAL, CKMB, CKMBINDEX, TROPONINI in the last 168 hours.  BNP: Invalid input(s): POCBNP  CBG: Recent Labs  Lab 08/10/18 0755 08/10/18 1231  GLUCAP 93 33    Microbiology: Results for orders placed or performed during the hospital encounter of 08/10/18  CULTURE, BLOOD (ROUTINE X 2) w Reflex to ID Panel     Status: None (Preliminary result)   Collection Time: 08/10/18 11:33 AM  Result Value Ref Range Status   Specimen Description BLOOD LEFT ANTECUBITAL  Final   Special Requests   Final    BOTTLES DRAWN AEROBIC AND ANAEROBIC Blood Culture results may not be optimal due to an excessive volume of blood received in culture bottles   Culture   Final    NO GROWTH 2 DAYS Performed at [facility], [address], [place], [place] [address]    Report Status PENDING  Incomplete  CULTURE, BLOOD (ROUTINE X 2) w Reflex to ID Panel     Status: None (Preliminary result)   Collection Time: 08/10/18 11:36 AM  Result Value Ref Range Status   Specimen Description BLOOD RIGHT ANTECUBITAL  Final   Special Requests    Final    BOTTLES DRAWN AEROBIC AND ANAEROBIC Blood Culture adequate volume   Culture   Final    NO GROWTH 2 DAYS Performed at [facility], [address], [place], [place] [address]    Report Status PENDING  Incomplete    Coagulation Studies: No results for input(s): LABPROT, INR in the last 72 hours.  Urinalysis:  Recent Labs  Lab 08/10/18 1020  COLORURINE AMBER*  LABSPEC 1.026  PHURINE 6.0  GLUCOSEU NEGATIVE  HGBUR MODERATE*  BILIRUBINUR NEGATIVE  KETONESUR 80*  PROTEINUR 100*  NITRITE NEGATIVE  LEUKOCYTESUR NEGATIVE    Lipid Panel:    Component Value Date/Time   CHOL 140 08/12/2018 0454   TRIG 107 08/12/2018 0454   HDL 43 08/12/2018 0454   CHOLHDL 3.3 08/12/2018 0454   VLDL 21 08/12/2018 0454   LDLCALC 76 08/12/2018 0454    HgbA1C: No results found for: HGBA1C  Urine Drug Screen:  No results found for: LABOPIA, COCAINSCRNUR, LABBENZ, AMPHETMU, THCU, LABBARB  Alcohol Level: No results for input(s): ETH in the last 168 hours.  Other results: EKG: normal EKG, normal sinus rhythm, unchanged from previous tracings, occasional PVC noted, unifocal. Vent. rate 94 BPM PR interval 144 ms QRS duration 102 ms QT/QTc 386/482 ms P-R-T axes -19 -6 166  Imaging: Ct Head Wo Contrast  Result Date: 08/10/2018 CLINICAL DATA:  Encephalopathy EXAM: CT HEAD WITHOUT CONTRAST TECHNIQUE: Contiguous axial images were obtained from the base of the skull through the vertex without intravenous contrast. COMPARISON:  11/21/2017 FINDINGS: Brain: Stable old lacunar infarct in the left internal capsule. No acute intracranial abnormality. Specifically, no hemorrhage, hydrocephalus, mass lesion, acute infarction, or significant intracranial injury. Vascular: No hyperdense vessel or unexpected calcification. Skull: No acute calvarial abnormality. Sinuses/Orbits: Mucosal thickening and air-fluid level in the left maxillary sinus. Other: None IMPRESSION: No acute intracranial  abnormality. Stable old left internal capsule lacunar infarct. Acute on chronic left maxillary sinusitis. Electronically Signed   By: Rolm Baptise M.D.   On: 08/10/2018 09:57   Mr Brain Wo Contrast  Result Date: 08/11/2018 CLINICAL DATA:  68 y/o  F; acute toxic metabolic encephalopathy. EXAM: MRI HEAD WITHOUT CONTRAST TECHNIQUE: Coronal DWI, axial DWI, and axial T2 FLAIR  weighted sequences were acquired. The patient was unable to continue and additional sequences were not acquired. COMPARISON:  None. FINDINGS: Motion degraded sequences. There are numerous subcentimeter foci of reduced diffusion scattered throughout the supratentorial brain and cerebellum predominantly within cortical in juxta cortical white matter compatible with acute/early subacute infarctions. The multiple infarctions are T2 FLAIR hyperintense. No associated mass effect. On the T2 FLAIR weighted sequence there is a background of mild chronic microvascular ischemic changes and volume loss of the brain. No abnormal CSF signal to suggest extra-axial collection. No hydrocephalus, mass effect, or herniation. Left maxillary sinus disease and fluid level. IMPRESSION: 1. Motion degraded and limited study. 2. Numerous subcentimeter foci of reduced diffusion compatible acute/early subacute infarction throughout the supratentorial brain and cerebellum. Multiple vascular territories favors embolic phenomenon. No associated mass effect. 3. Background of mild chronic microvascular ischemic changes and volume loss of the brain. 4. Left maxillary sinus disease and fluid level. These results will be called to the ordering clinician or representative by the Radiologist Assistant, and communication documented in the PACS or zVision Dashboard. Electronically Signed   By: Kristine Garbe M.D.   On: 08/11/2018 21:56   Assessment: 68 y.o. female with past medical history of hypertension, anxiety and deep vein thrombosis (DVT) of popliteal vein of right  lower extremity presenting to the ED 08/10/2018 with altered mental status.  MRI of the brain reviewed and shows numerous subcentimeter foci of reduced diffusion compatible acute/early subacute infarction throughout the supratentorial brain and cerebellum. Multiple vascular territories involvement concerning for embolic etiology.  Patient was recently started on Xarelto for DVT which was switched to Eliquis on 08/06/2018.  Patient unsure if she was taking her medication correctly.  States she may have been taking it once a day?. Is possible this could be paradoxical embolism, septic emboli given elevated WBC on presentation. Other differentials include seizures and hypercoagulable state. Further work up recommended.  Stroke Risk Factors - hypercoagulable state and hypertension  Plan: 1. Check HgbA1c, UDS 2. EEG 3. PT consult, OT consult, Speech consult 4. Echocardiogram with bubble study if unremarkable will perform TEE. 6. Prophylactic therapy-Anticoagulation: Eliquis- dose 5mg  BID 7. NPO until RN stroke swallow screen 8. Telemetry monitoring 9. Frequent neuro checks  This patient was staffed with Dr. Irish Elders, Alease Frame who personally evaluated patient, reviewed documentation and agreed with assessment and plan of care as above.  Rufina Falco, DNP, FNP-BC Board certified Nurse Practitioner Neurology Department  08/12/2018, 9:26 AM

## 2018-08-12 NOTE — Progress Notes (Signed)
[facility]  ROUNDING NOTE   Subjective:   Daughter at bedside  MRI with multiple emboli   Na 134  Objective:  Vital signs in last 24 hours:  Temp:  [97.5 F (36.4 C)-98.1 F (36.7 C)] 97.5 F (36.4 C) (03/04 0816) Pulse Rate:  [96-108] 103 (03/04 0816) Resp:  [18-20] 18 (03/04 0742) BP: (135-168)/(73-94) 168/83 (03/04 0816) SpO2:  [96 %-97 %] 96 % (03/04 0816)  Weight change:  Filed Weights   08/10/18 0759  Weight: 103.9 kg    Intake/Output: I/O last 3 completed shifts: In: 792.9 [I.V.:792.9] Out: 250 [Urine:250]   Intake/Output this shift:  No intake/output data recorded.  Physical Exam: General: NAD, laying in bed  Head: Normocephalic, atraumatic. Dry oral mucosal membranes  Eyes: Anicteric, PERRL  Neck: Supple, trachea midline  Lungs:  Clear to auscultation  Heart: Regular rate and rhythm  Abdomen:  Soft, nontender,   Extremities: no peripheral edema.  Neurologic: Lethargic, slow to respond  Skin: No lesions        Basic Metabolic Panel: Recent Labs  Lab 08/10/18 0803 08/10/18 1327 08/11/18 0505 08/12/18 0454  NA 125* 129* 130* 134*  K 2.8* 3.5 2.9* 3.7  CL 86* 92* 96* 102  CO2 24 25 21* 23  GLUCOSE 119* 99 91 110*  BUN 16 14 14 12   CREATININE 0.56 0.55 0.42* 0.45  CALCIUM 9.1 8.0* 8.1* 8.1*  MG 1.7  --  1.9 1.8    Liver Function Tests: Recent Labs  Lab 08/10/18 0803  AST 28  ALT 15  ALKPHOS 297*  BILITOT 1.5*  PROT 7.4  ALBUMIN 3.4*   No results for input(s): LIPASE, AMYLASE in the last 168 hours. Recent Labs  Lab 08/11/18 1346  AMMONIA 12    CBC: Recent Labs  Lab 08/10/18 0803 08/11/18 0505  WBC 17.1* 12.9*  HGB 13.2 11.1*  HCT 38.6 33.3*  MCV 83.0 84.9  PLT 143* 121*    Cardiac Enzymes: No results for input(s): CKTOTAL, CKMB, CKMBINDEX, TROPONINI in the last 168 hours.  BNP: Invalid input(s): POCBNP  CBG: Recent Labs  Lab 08/10/18 0755 08/10/18 1231  GLUCAP 93 87     Microbiology: Results for orders placed or performed during the hospital encounter of 08/10/18  CULTURE, BLOOD (ROUTINE X 2) w Reflex to ID Panel     Status: None (Preliminary result)   Collection Time: 08/10/18 11:33 AM  Result Value Ref Range Status   Specimen Description BLOOD LEFT ANTECUBITAL  Final   Special Requests   Final    BOTTLES DRAWN AEROBIC AND ANAEROBIC Blood Culture results may not be optimal due to an excessive volume of blood received in culture bottles   Culture   Final    NO GROWTH 2 DAYS Performed at [facility], [address] [place], [place] [address]    Report Status PENDING  Incomplete  CULTURE, BLOOD (ROUTINE X 2) w Reflex to ID Panel     Status: None (Preliminary result)   Collection Time: 08/10/18 11:36 AM  Result Value Ref Range Status   Specimen Description BLOOD RIGHT ANTECUBITAL  Final   Special Requests   Final    BOTTLES DRAWN AEROBIC AND ANAEROBIC Blood Culture adequate volume   Culture   Final    NO GROWTH 2 DAYS Performed at [facility], [address] [place], [place] [address]    Report Status PENDING  Incomplete    Coagulation Studies: No results for input(s): LABPROT, INR in  the last 72 hours.  Urinalysis: Recent Labs    08/10/18 1020  COLORURINE AMBER*  LABSPEC 1.026  PHURINE 6.0  GLUCOSEU NEGATIVE  HGBUR MODERATE*  BILIRUBINUR NEGATIVE  KETONESUR 80*  PROTEINUR 100*  NITRITE NEGATIVE  LEUKOCYTESUR NEGATIVE      Imaging: Mr Brain Wo Contrast  Result Date: 08/11/2018 CLINICAL DATA:  68 y/o  F; acute toxic metabolic encephalopathy. EXAM: MRI HEAD WITHOUT CONTRAST TECHNIQUE: Coronal DWI, axial DWI, and axial T2 FLAIR weighted sequences were acquired. The patient was unable to continue and additional sequences were not acquired. COMPARISON:  None. FINDINGS: Motion degraded sequences. There are numerous subcentimeter foci of reduced diffusion scattered throughout the supratentorial brain and cerebellum  predominantly within cortical in juxta cortical white matter compatible with acute/early subacute infarctions. The multiple infarctions are T2 FLAIR hyperintense. No associated mass effect. On the T2 FLAIR weighted sequence there is a background of mild chronic microvascular ischemic changes and volume loss of the brain. No abnormal CSF signal to suggest extra-axial collection. No hydrocephalus, mass effect, or herniation. Left maxillary sinus disease and fluid level. IMPRESSION: 1. Motion degraded and limited study. 2. Numerous subcentimeter foci of reduced diffusion compatible acute/early subacute infarction throughout the supratentorial brain and cerebellum. Multiple vascular territories favors embolic phenomenon. No associated mass effect. 3. Background of mild chronic microvascular ischemic changes and volume loss of the brain. 4. Left maxillary sinus disease and fluid level. These results will be called to the ordering clinician or representative by the Radiologist Assistant, and communication documented in the PACS or zVision Dashboard. Electronically Signed   By: Kristine Garbe M.D.   On: 08/11/2018 21:56   US Carotid Bilateral  Result Date: 08/12/2018 CLINICAL DATA:  CVA EXAM: BILATERAL CAROTID DUPLEX ULTRASOUND TECHNIQUE: Pearline Cables scale imaging, color Doppler and duplex ultrasound were performed of bilateral carotid and vertebral arteries in the neck. COMPARISON:  None. FINDINGS: Criteria: Quantification of carotid stenosis is based on velocity parameters that correlate the residual internal carotid diameter with NASCET-based stenosis levels, using the diameter of the distal internal carotid lumen as the denominator for stenosis measurement. The following velocity measurements were obtained: RIGHT ICA: 85 cm/sec CCA: 659 cm/sec SYSTOLIC ICA/CCA RATIO:  0.8 ECA: 103 cm/sec LEFT ICA: 91 cm/sec CCA: 93 cm/sec SYSTOLIC ICA/CCA RATIO:  1.0 ECA: 51 cm/sec RIGHT CAROTID ARTERY: Little if any plaque in  the bulb. Low resistance internal carotid Doppler pattern. RIGHT VERTEBRAL ARTERY:  Antegrade. LEFT CAROTID ARTERY: Little if any plaque in the bulb. Low resistance internal carotid Doppler pattern. LEFT VERTEBRAL ARTERY:  Antegrade. IMPRESSION: Less than 50% stenosis in the right and left internal carotid arteries. Electronically Signed   By: Marybelle Killings M.D.   On: 08/12/2018 10:36     Medications:   . 0.9 % NaCl with KCl 20 mEq / L 75 mL/hr at 08/11/18 0600   . amoxicillin-clavulanate  1 tablet Oral Q12H  . apixaban  5 mg Oral BID  . feeding supplement (ENSURE ENLIVE)  237 mL Oral TID BM  . fluticasone  2 spray Each Nare Daily  . ketamine (KETALAR) injection 10mg /mL (IV use)  1 mg/kg Intravenous Once  . lisinopril  40 mg Oral Daily  . LORazepam  1 mg Intravenous Once  . multivitamin with minerals  1 tablet Oral Daily   acetaminophen **OR** acetaminophen, haloperidol lactate, hydrALAZINE, ondansetron **OR** ondansetron (ZOFRAN) IV, pantoprazole  Assessment/ Plan:  Diana Todd is a 68 y.o. white female with hypertension, DVT, depression, osteoarthritis, who was  admitted to [facility] on 08/10/2018 for weakness . Found to have hyponatremia, hypokalemia and hypomagnesemia. Secondary to poor PO intake from acute cerebrovascular event.   1. Hyponatremia 2. Hypokalemia 3. Hypomagnesemia 4. Metabolic encephalopathy 5. Hypertension  Plan Continue sodium chloride infusion with potassium.  Continue potassium replacement.   Will sign off   LOS: 2 Giabella Duhart 3/4/20201:55 PM

## 2018-08-12 NOTE — Progress Notes (Signed)
*  PRELIMINARY RESULTS* Echocardiogram 2D Echocardiogram has been performed.  Sherrie Sport 08/12/2018, 12:45 PM

## 2018-08-12 NOTE — Progress Notes (Signed)
eeg completed ° °

## 2018-08-12 NOTE — Progress Notes (Signed)
[facility] [place] NAME: Diana Todd    MR#:  [id number]  DATE OF BIRTH:  1951/05/28  SUBJECTIVE:  Patient presented with altered mental status. MRI shows embolic CVA She has been on anticoagulation for DVT  Patient's daughter reports poor p.o. intake over the past several days  REVIEW OF SYSTEMS:    Review of Systems  Constitutional: Negative for fever, chills weight loss Generalized poor p.o. intake HENT: Negative for ear pain, nosebleeds, congestion, facial swelling, rhinorrhea, neck pain, neck stiffness and ear discharge.   Respiratory: Negative for cough, shortness of breath, wheezing  Cardiovascular: Negative for chest pain, palpitations and leg swelling.  Gastrointestinal: Negative for heartburn, abdominal pain, vomiting, diarrhea or consitpation Genitourinary: Negative for dysuria, urgency, frequency, hematuria Musculoskeletal: Negative for back pain or joint pain Neurological: Negative for dizziness, seizures, syncope, focal weakness,  numbness and headaches.  Hematological: Does not bruise/bleed easily.  Psychiatric/Behavioral: Negative for hallucinations, confusion, dysphoric mood    Tolerating Diet: yes      DRUG ALLERGIES:  No Known Allergies  VITALS:  Blood pressure (!) 168/83, pulse (!) 103, temperature (!) 97.5 F (36.4 C), temperature source Oral, resp. rate 18, height 5\' 3"  (1.6 m), weight 103.9 kg, SpO2 96 %.  PHYSICAL EXAMINATION:  Constitutional: Appears well-developed and well-nourished. No distress. HENT: Normocephalic. Marland Kitchen Oropharynx is clear and moist.  Eyes: Conjunctivae and EOM are normal. PERRLA, no scleral icterus.  Neck: Normal ROM. Neck supple. No JVD. No tracheal deviation. CVS: RRR, S1/S2 +, no murmurs, no gallops, no carotid bruit.  Pulmonary: Effort and breath sounds normal, no stridor, rhonchi, wheezes, rales.  Abdominal: Soft. BS +,  no distension, tenderness, rebound or guarding.   Musculoskeletal: Normal range of motion. No edema and no tenderness.  Neuro: Alert. CN 2-12 grossly intact. No focal deficits. Skin: Skin is warm and dry. No rash noted. Psychiatric: Normal mood and affect.      LABORATORY PANEL:   CBC Recent Labs  Lab 08/11/18 0505  WBC 12.9*  HGB 11.1*  HCT 33.3*  PLT 121*   ------------------------------------------------------------------------------------------------------------------  Chemistries  Recent Labs  Lab 08/10/18 0803  08/12/18 0454  NA 125*   < > 134*  K 2.8*   < > 3.7  CL 86*   < > 102  CO2 24   < > 23  GLUCOSE 119*   < > 110*  BUN 16   < > 12  CREATININE 0.56   < > 0.45  CALCIUM 9.1   < > 8.1*  MG 1.7   < > 1.8  AST 28  --   --   ALT 15  --   --   ALKPHOS 297*  --   --   BILITOT 1.5*  --   --    < > = values in this interval not displayed.   ------------------------------------------------------------------------------------------------------------------  Cardiac Enzymes No results for input(s): TROPONINI in the last 168 hours. ------------------------------------------------------------------------------------------------------------------  RADIOLOGY:  Mr Brain 37 Contrast  Result Date: 08/11/2018 CLINICAL DATA:  68 y/o  F; acute toxic metabolic encephalopathy. EXAM: MRI HEAD WITHOUT CONTRAST TECHNIQUE: Coronal DWI, axial DWI, and axial T2 FLAIR weighted sequences were acquired. The patient was unable to continue and additional sequences were not acquired. COMPARISON:  None. FINDINGS: Motion degraded sequences. There are numerous subcentimeter foci of reduced diffusion scattered throughout the supratentorial brain and cerebellum predominantly within cortical in juxta cortical white matter compatible with acute/early subacute infarctions. The multiple infarctions  are T2 FLAIR hyperintense. No associated mass effect. On the T2 FLAIR weighted sequence there is a background of mild chronic microvascular ischemic  changes and volume loss of the brain. No abnormal CSF signal to suggest extra-axial collection. No hydrocephalus, mass effect, or herniation. Left maxillary sinus disease and fluid level. IMPRESSION: 1. Motion degraded and limited study. 2. Numerous subcentimeter foci of reduced diffusion compatible acute/early subacute infarction throughout the supratentorial brain and cerebellum. Multiple vascular territories favors embolic phenomenon. No associated mass effect. 3. Background of mild chronic microvascular ischemic changes and volume loss of the brain. 4. Left maxillary sinus disease and fluid level. These results will be called to the ordering clinician or representative by the Radiologist Assistant, and communication documented in the PACS or zVision Dashboard. Electronically Signed   By: Kristine Garbe M.D.   On: 08/11/2018 21:56   US Carotid Bilateral  Result Date: 08/12/2018 CLINICAL DATA:  CVA EXAM: BILATERAL CAROTID DUPLEX ULTRASOUND TECHNIQUE: Pearline Cables scale imaging, color Doppler and duplex ultrasound were performed of bilateral carotid and vertebral arteries in the neck. COMPARISON:  None. FINDINGS: Criteria: Quantification of carotid stenosis is based on velocity parameters that correlate the residual internal carotid diameter with NASCET-based stenosis levels, using the diameter of the distal internal carotid lumen as the denominator for stenosis measurement. The following velocity measurements were obtained: RIGHT ICA: 85 cm/sec CCA: 378 cm/sec SYSTOLIC ICA/CCA RATIO:  0.8 ECA: 103 cm/sec LEFT ICA: 91 cm/sec CCA: 93 cm/sec SYSTOLIC ICA/CCA RATIO:  1.0 ECA: 51 cm/sec RIGHT CAROTID ARTERY: Little if any plaque in the bulb. Low resistance internal carotid Doppler pattern. RIGHT VERTEBRAL ARTERY:  Antegrade. LEFT CAROTID ARTERY: Little if any plaque in the bulb. Low resistance internal carotid Doppler pattern. LEFT VERTEBRAL ARTERY:  Antegrade. IMPRESSION: Less than 50% stenosis in the right and  left internal carotid arteries. Electronically Signed   By: Marybelle Killings M.D.   On: 08/12/2018 10:36     ASSESSMENT AND PLAN:   68 year old female with a history of depression, hypertension and DVT on Eliquis who presented with altered mental status.  1.  Acute metabolic encephalopathy: Encephalopathy multifactorial due to electrolyte abnormalities as well as Numerous subcentimeter foci of reduced diffusion compatible acute/early subacute infarction throughout the supratentorial brain and cerebellum. LDL 76 Electrolytes are being repleted PRN Continue CVA work-up including echo with bubble Carotid Doppler shows no hemodynamically significant stenosis PT, OT and speech consultation along with neurology consultation EEG  If bubble does not show thrombus patient will need TEE Continue telemetry Continue neurochecks  2.  Hyponatremia: This has improved with IV fluids 3.  Hypertension: Continue lisinopril  4.  History of DVT: Continue Eliquis  5.  Acute sinusitis: Continue Augmentin    Management plans discussed with the patient and daughter and she  is in agreement.  CODE STATUS: full  TOTAL TIME TAKING CARE OF THIS PATIENT: 30 minutes.     POSSIBLE D/C 1-2 days, DEPENDING ON CLINICAL CONDITION.   Melodye Swor M.D on 08/12/2018 at 11:57 AM  Between 7am to 6pm - Pager - [phone] After 6pm go to [email or web address] - password [email or web address] [facility]  Office  [phone]  CC: Primary care physician; System, Provider Not In  Note: This dictation was prepared with Dragon dictation along with smaller phrase technology. Any transcriptional errors that result from this process are unintentional.

## 2018-08-12 NOTE — Procedures (Signed)
  [employer] A. Merlene Laughter, MD     [email or web address]           HISTORY: This is a 68 year old female who presents with altered mental status.  There is concern that seizures could be part of the etiology and hence the EEG obtained.  MEDICATIONS:  Current Facility-Administered Medications:  .  0.9 % NaCl with KCl 20 mEq/ L  infusion, , Intravenous, Continuous, Wieting, Richard, MD, Last Rate: 75 mL/hr at 08/11/18 0600 .  acetaminophen (TYLENOL) tablet 650 mg, 650 mg, Oral, Q6H PRN, 650 mg at 08/10/18 2254 **OR** acetaminophen (TYLENOL) suppository 650 mg, 650 mg, Rectal, Q6H PRN, Leslye Peer, Richard, MD .  amoxicillin-clavulanate (AUGMENTIN) 875-125 MG per tablet 1 tablet, 1 tablet, Oral, Q12H, Salary, Montell D, MD, 1 tablet at 08/12/18 1000 .  apixaban (ELIQUIS) tablet 5 mg, 5 mg, Oral, BID, Wieting, Richard, MD, 5 mg at 08/12/18 1000 .  feeding supplement (ENSURE ENLIVE) (ENSURE ENLIVE) liquid 237 mL, 237 mL, Oral, TID BM, Salary, Montell D, MD, 237 mL at 08/12/18 1400 .  fluticasone (FLONASE) 50 MCG/ACT nasal spray 2 spray, 2 spray, Each Nare, Daily, Wieting, Richard, MD, 2 spray at 08/12/18 1840 .  haloperidol lactate (HALDOL) injection 1 mg, 1 mg, Intravenous, Q6H PRN, Leslye Peer, Richard, MD, 1 mg at 08/12/18 8676 .  hydrALAZINE (APRESOLINE) injection 10 mg, 10 mg, Intravenous, Q6H PRN, Mody, Sital, MD .  ketamine (KETALAR) injection 104 mg, 1 mg/kg, Intravenous, Once, Wieting, Richard, MD .  lisinopril (PRINIVIL,ZESTRIL) tablet 40 mg, 40 mg, Oral, Daily, Wieting, Richard, MD, 40 mg at 08/12/18 1000 .  LORazepam (ATIVAN) injection 1 mg, 1 mg, Intravenous, Once, Lance Coon, MD .  multivitamin with minerals tablet 1 tablet, 1 tablet, Oral, Daily, Salary, Montell D, MD, 1 tablet at 08/12/18 1000 .  ondansetron (ZOFRAN) tablet 4 mg, 4 mg, Oral, Q6H PRN **OR** ondansetron (ZOFRAN) injection 4 mg, 4 mg, Intravenous, Q6H PRN, Wieting, Richard, MD .  pantoprazole (PROTONIX) EC  tablet 40 mg, 40 mg, Oral, Daily PRN, Wieting, Richard, MD     ANALYSIS: A 16 channel recording using standard 10 20 measurements is conducted for 21 minutes.  The background activity is that of 10 Hz rhythm.  There is beta activity observed in frontal areas.  The recording somewhat degraded by myogenic and movement artifact.  Photic stimulation and hyperventilation are not carried out.  There is no focal or lateral slowing.  There is no epileptiform activity is observed.   IMPRESSION: 1.  This is a normal recording of the awake and drowsy states.      Miriya Cloer A. Merlene Laughter, M.D.  Diplomate, Tax adviser of Psychiatry and Neurology ( Neurology).

## 2018-08-12 NOTE — Progress Notes (Signed)
Responded to PIV consult. Per RN report, pt has pulled out Midline and PIV both started within less than the last 24 hours. Spoke with floor RN regarding available methods to prevent this for future access. New PIV site wrapped and mittens placed on pt by floor RN.

## 2018-08-12 NOTE — Progress Notes (Signed)
Rehab Admissions Coordinator Note:  Per OT recommendation, this patient was screened by Jhonnie Garner for appropriateness for an Inpatient Acute Rehab Consult.  At this time, we are recommending Inpatient Rehab consult. AC will contact MD to request an IP Rehab Consult Order.   Jhonnie Garner 08/12/2018, 5:23 PM  I can be reached at [phone].

## 2018-08-13 ENCOUNTER — Inpatient Hospital Stay (HOSPITAL_COMMUNITY)
Admit: 2018-08-13 | Discharge: 2018-08-13 | Disposition: A | Discharge status: Home / Self Care | Payer: PPO | Attending: Nurse Practitioner | Admitting: Nurse Practitioner

## 2018-08-13 ENCOUNTER — Encounter
Admission: EM | Disposition: A | Discharge status: Home / Self Care | Payer: Self-pay | Source: Home / Self Care | Attending: Internal Medicine

## 2018-08-13 DIAGNOSIS — I351 Nonrheumatic aortic (valve) insufficiency: Secondary | ICD-10-CM

## 2018-08-13 DIAGNOSIS — I361 Nonrheumatic tricuspid (valve) insufficiency: Secondary | ICD-10-CM

## 2018-08-13 DIAGNOSIS — I420 Dilated cardiomyopathy: Secondary | ICD-10-CM

## 2018-08-13 DIAGNOSIS — I6310 Cerebral infarction due to embolism of unspecified precerebral artery: Secondary | ICD-10-CM

## 2018-08-13 DIAGNOSIS — I340 Nonrheumatic mitral (valve) insufficiency: Secondary | ICD-10-CM

## 2018-08-13 DIAGNOSIS — G9340 Encephalopathy, unspecified: Secondary | ICD-10-CM

## 2018-08-13 DIAGNOSIS — Z7189 Other specified counseling: Secondary | ICD-10-CM

## 2018-08-13 DIAGNOSIS — I25118 Atherosclerotic heart disease of native coronary artery with other forms of angina pectoris: Secondary | ICD-10-CM

## 2018-08-13 LAB — BASIC METABOLIC PANEL
Anion gap: 11 (ref 5–15)
BUN: 10 mg/dL (ref 8–23)
CO2: 21 mmol/L — AB (ref 22–32)
Calcium: 8.3 mg/dL — ABNORMAL LOW (ref 8.9–10.3)
Chloride: 100 mmol/L (ref 98–111)
Creatinine, Ser: 0.42 mg/dL — ABNORMAL LOW (ref 0.44–1.00)
GFR calc Af Amer: >60 mL/min (ref >60)
GFR calc non Af Amer: >60 mL/min (ref >60)
GLUCOSE: 113 mg/dL — AB (ref 70–99)
Potassium: 3.7 mmol/L (ref 3.5–5.1)
Sodium: 132 mmol/L — ABNORMAL LOW (ref 135–145)

## 2018-08-13 SURGERY — ECHOCARDIOGRAM, TRANSESOPHAGEAL
Anesthesia: Moderate Sedation

## 2018-08-13 MED ORDER — LIDOCAINE VISCOUS HCL 2 % MT SOLN
OROMUCOSAL | Status: AC | PRN
  Administered 2018-08-13: 10 mL via OROMUCOSAL

## 2018-08-13 MED ORDER — BUTAMBEN-TETRACAINE-BENZOCAINE 2-2-14 % EX AERO
1.0000 | INHALATION_SPRAY | Freq: Once | CUTANEOUS | Status: DC
  Filled 2018-08-13: qty 20

## 2018-08-13 MED ORDER — SODIUM CHLORIDE FLUSH 0.9 % IV SOLN
INTRAVENOUS | Status: AC
  Filled 2018-08-13: qty 10

## 2018-08-13 MED ORDER — MIDAZOLAM HCL 5 MG/5ML IJ SOLN
INTRAMUSCULAR | Status: AC
  Filled 2018-08-13: qty 5

## 2018-08-13 MED ORDER — FENTANYL CITRATE (PF) 100 MCG/2ML IJ SOLN
INTRAMUSCULAR | Status: AC
  Filled 2018-08-13: qty 2

## 2018-08-13 MED ORDER — LISINOPRIL 20 MG PO TABS: 40.0000 mg | ORAL_TABLET | Freq: Every day | ORAL | Status: DC

## 2018-08-13 MED ORDER — SODIUM CHLORIDE 0.9 % IV SOLN: INTRAVENOUS | Status: DC

## 2018-08-13 MED ORDER — FENTANYL CITRATE (PF) 100 MCG/2ML IJ SOLN
INTRAMUSCULAR | Status: AC | PRN
  Administered 2018-08-13 (×4): 25 ug via INTRAVENOUS

## 2018-08-13 MED ORDER — MAGNESIUM SULFATE 2 GM/50ML IV SOLN
2.0000 g | Freq: Once | INTRAVENOUS | Status: AC
  Administered 2018-08-13: 08:00:00 2 g via INTRAVENOUS
  Filled 2018-08-13: qty 50

## 2018-08-13 MED ORDER — BUTAMBEN-TETRACAINE-BENZOCAINE 2-2-14 % EX AERO
INHALATION_SPRAY | CUTANEOUS | Status: AC | PRN
  Administered 2018-08-13: 2 via TOPICAL

## 2018-08-13 MED ORDER — MIDAZOLAM HCL 5 MG/5ML IJ SOLN
INTRAMUSCULAR | Status: AC | PRN
  Administered 2018-08-13 (×4): 1 mg via INTRAVENOUS

## 2018-08-13 MED ORDER — LIDOCAINE VISCOUS HCL 2 % MT SOLN
OROMUCOSAL | Status: AC
  Filled 2018-08-13: qty 15

## 2018-08-13 MED ORDER — ATENOLOL 50 MG PO TABS
50.0000 mg | ORAL_TABLET | Freq: Every day | ORAL | Status: DC
  Administered 2018-08-13 – 2018-08-14 (×2): 50 mg via ORAL
  Filled 2018-08-13 (×2): qty 1

## 2018-08-13 NOTE — Progress Notes (Signed)
*  PRELIMINARY RESULTS* Echocardiogram Echocardiogram Transesophageal has been performed.  Sherrie Sport 08/13/2018, 3:57 PM

## 2018-08-13 NOTE — NC FL2 (Signed)
[place] LEVEL OF CARE SCREENING TOOL     IDENTIFICATION  Patient Name: Diana Todd Birthdate: [birth year] Sex: female Admission Date (Current Location): 08/10/2018  [place] and [place] Number:  Engineering geologist and Address:  [facility], [address] [place], [place] [address]      Provider Number: [id number]  Attending Physician Name and Address:  Bettey Costa, MD  Relative Name and Phone Number:       Current Level of Care: Hospital Recommended Level of Care: [place] Prior Approval Number:    Date Approved/Denied:   PASRR Number:    Discharge Plan: SNF    Current Diagnoses: Patient Active Problem List   Diagnosis Date Noted  . Hyponatremia 08/10/2018    Orientation RESPIRATION BLADDER Height & Weight     Self, Place  Normal Continent Weight: 229 lb (103.9 kg) Height:  5\' 3"  (160 cm)  BEHAVIORAL SYMPTOMS/MOOD NEUROLOGICAL BOWEL NUTRITION STATUS      Continent Diet(Diet: NPO to be advanced. )  AMBULATORY STATUS COMMUNICATION OF NEEDS Skin   Extensive Assist Verbally Normal                       Personal Care Assistance Level of Assistance  Bathing, Feeding, Dressing Bathing Assistance: Limited assistance Feeding assistance: Independent Dressing Assistance: Limited assistance     Functional Limitations Info  Sight, Hearing, Speech Sight Info: Adequate Hearing Info: Adequate Speech Info: Adequate    SPECIAL CARE FACTORS FREQUENCY  PT (By licensed PT), OT (By licensed OT)     PT Frequency: (5) OT Frequency: (5)            Contractures      Additional Factors Info  Code Status, Allergies Code Status Info: (Full Code. ) Allergies Info: (Macrodantin Nitrofurantoin Macrocrystal)           Current Medications (08/13/2018):  This is the current hospital active medication list Current Facility-Administered Medications  Medication Dose Route Frequency Provider Last Rate Last  Dose  . 0.9 %  sodium chloride infusion   Intravenous Continuous End, Christopher, MD      . 0.9 % NaCl with KCl 20 mEq/ L  infusion   Intravenous Continuous Wieting, Richard, MD 75 mL/hr at 08/13/18 1324    . acetaminophen (TYLENOL) tablet 650 mg  650 mg Oral Q6H PRN Loletha Grayer, MD   650 mg at 08/10/18 2254   Or  . acetaminophen (TYLENOL) suppository 650 mg  650 mg Rectal Q6H PRN Loletha Grayer, MD      . amoxicillin-clavulanate (AUGMENTIN) 875-125 MG per tablet 1 tablet  1 tablet Oral Q12H Salary, Montell D, MD   1 tablet at 08/13/18 0744  . apixaban (ELIQUIS) tablet 5 mg  5 mg Oral BID Loletha Grayer, MD   5 mg at 08/13/18 0744  . atenolol (TENORMIN) tablet 50 mg  50 mg Oral Daily Mody, Sital, MD   50 mg at 08/13/18 1022  . feeding supplement (ENSURE ENLIVE) (ENSURE ENLIVE) liquid 237 mL  237 mL Oral TID BM Salary, Montell D, MD   237 mL at 08/12/18 2013  . fentaNYL (SUBLIMAZE) 100 MCG/2ML injection           . fluticasone (FLONASE) 50 MCG/ACT nasal spray 2 spray  2 spray Each Nare Daily Loletha Grayer, MD   2 spray at 08/13/18 0744  . haloperidol lactate (HALDOL) injection 1 mg  1 mg Intravenous Q6H PRN Loletha Grayer, MD  1 mg at 08/12/18 1517  . hydrALAZINE (APRESOLINE) injection 10 mg  10 mg Intravenous Q6H PRN Bettey Costa, MD      . ketamine (KETALAR) injection 104 mg  1 mg/kg Intravenous Once Wieting, Richard, MD      . lidocaine (XYLOCAINE) 2 % viscous mouth solution           . lisinopril (PRINIVIL,ZESTRIL) tablet 40 mg  40 mg Oral Daily Loletha Grayer, MD   40 mg at 08/13/18 0744  . LORazepam (ATIVAN) injection 1 mg  1 mg Intravenous Once Lance Coon, MD      . midazolam (VERSED) 5 MG/5ML injection           . multivitamin with minerals tablet 1 tablet  1 tablet Oral Daily Salary, Avel Peace, MD   1 tablet at 08/13/18 0744  . ondansetron (ZOFRAN) tablet 4 mg  4 mg Oral Q6H PRN Loletha Grayer, MD       Or  . ondansetron (ZOFRAN) injection 4 mg  4 mg Intravenous  Q6H PRN Wieting, Richard, MD      . pantoprazole (PROTONIX) EC tablet 40 mg  40 mg Oral Daily PRN Wieting, Richard, MD      . sodium chloride flush 0.9 % injection            Facility-Administered Medications Ordered in Other Encounters  Medication Dose Route Frequency Provider Last Rate Last Dose  . butamben-tetracaine-benzocaine (CETACAINE) spray 1 spray  1 spray Topical Once Ouma, Bing Neighbors, NP         Discharge Medications: Please see discharge summary for a list of discharge medications.  Relevant Imaging Results:  Relevant Lab Results:   Additional Information    Delvis Kau, Veronia Beets, LCSW

## 2018-08-13 NOTE — Care Management Note (Signed)
Case Management Note  Patient Details  Name: Diana Todd MRN: [id number] Date of Birth: 03/12/1951  Subjective/Objective:  Admitted to [facility] with the diagnosis of hyponatremia. Lives alone. Daughter is Diana Todd [phone]). [name] lives in [place]. Sees Dr. Sharen Hones. Last seen this physician a couple of weeks ago. Prescriptions are filled at [facility] in [place],.  Takes care of all basic activities of daily living herself, drives. Employed as  A school bus driver.  No home Health. No skilled facility. No home oxygen, No medical equipment in the home. Plans to go back to [facility] when discharged from this facility.  Daughter or one of her three daughters can come to the home                 Action/Plan: Physical therapy evaluation in progress. [place] query completed. One placed on chart. One given to Diana Todd.  Diana Todd for home services.   Expected Discharge Date:                  Expected Discharge Plan:     In-House Referral:   yes  Discharge planning Services   yes  Post Acute Care Choice:   yes Choice offered to:   [name]  DME Arranged:    DME Agency:     [facility] Arranged:    [place] Agency:   [facility]  Status of Service:     If discussed at [place] of Stay Meetings, dates discussed:    Additional Comments:  Diana Ammons, RN MSN CCM Care Management [phone] 08/13/2018, 9:20 AM

## 2018-08-13 NOTE — Sedation Documentation (Signed)
Total sedation: Versed 4 mg IV, Fentanyl 100 mcg IV. Pt. Tolerated procedure well.

## 2018-08-13 NOTE — Progress Notes (Signed)
OT Cancellation Note  Patient Details Name: [name] MRN: [id number] DOB: 1950-12-23   Cancelled Treatment:    Reason Eval/Treat Not Completed: Patient's level of consciousness. Chart reviewed, spoke with RN. Per RN, pt remains sedated, recently back from procedure. Unable to participate in therapy today. Will re-attempt next date as medically appropriate.   Jeni Salles, MPH, MS, OTR/L ascom [phone] 08/13/18, 4:30 PM

## 2018-08-13 NOTE — Procedures (Signed)
Patient Name:   Diana Todd Date of Exam: 08/13/2018 Medical Rec #:  [id number]         Height:       63.0 in Accession #:    [id number]        Weight:       229.0 lb Date of Birth:  08/31/1950         BSA:          2.05 m Patient Age:    68 years          BP:           137/80 mmHg Patient Gender: F                 HR:           89 bpm. Exam Location:  [facility]    Procedure: Transesophageal Echo  Indications:     Stroke 434.91   History:         Patient has prior history of Echocardiogram examinations. Risk                  Factors: Hypertension. Anxiety, DVT.   Sonographer:     Sherrie Sport RDCS (AE) Referring Phys:  [id number] Lang Snow Diagnosing Phys: Ida Rogue MD     PROCEDURE: Local oropharyngeal anesthetic was provided with Benzocaine spray. Imaged were obtained with the patient in a left lateral decubitus position. Image quality was adequate. The patient developed no complications during the procedure.  Anesthesia provided Versed 4 mg IV push Fentanyl 100 mcg IV push Patient was monitored closely by myself from 3 PM to 4 PM  IMPRESSIONS   1. The left ventricle has moderate-severely reduced systolic function, with an ejection fraction of 30-35%. Hypokinesis of the anterior, anteroseptal and possible akinesis of the apical region. Grossly no mural thrombus noted though thrombus can not be  definatively excluded.  2. The right ventricle has normal systolc function.  3. Left atrial size was moderate to severely dilated.  4. Left atrial appendage not well visualized, unable to exclude thrombus. no thombus in left atrium.  5. Mitral valve regurgitation is moderate by color flow Doppler.  6. Aortic valve regurgitation is moderate by color flow Doppler. no stenosis of the aortic valve.   FINDINGS  Left Ventricle: The left ventricle has moderate-severely reduced systolic function, with an ejection fraction of 30-35%. Right Ventricle: The right ventricle has  normal systolic function. There is no increase in right ventricular wall thickness. Left Atrium: Left atrial size was severely dilated. Right Atrium: Right atrial size was normal in size. Right atrial pressure is estimated at 10 mmHg. Interatrial Septum: No atrial level shunt detected by color flow Doppler. Agitated saline contrast was given intravenously to evaluate for intracardiac shunting. Saline contrast bubble study was negative, with no evidence of any interatrial shunt. Mitral Valve: The mitral valve is normal in structure. Mitral valve regurgitation is moderate by color flow Doppler. Tricuspid Valve: The tricuspid valve was normal in structure. Tricuspid valve regurgitation is mild by color flow Doppler. Aortic Valve: The aortic valve is tricuspid Moderate thickening of the aortic valve. Aortic valve regurgitation is moderate by color flow Doppler. There is no stenosis of the aortic valve. Aorta: The aortic root and ascending aorta are normal in size and structure. Venous: The inferior vena cava was not well visualized.   RIGHT ATRIUM RA Pressure: 10 mmHg    Ida Rogue MD Electronically signed by Ida Rogue MD Signature  Date/Time: 08/13/2018/6:29:34 PM

## 2018-08-13 NOTE — Sedation Documentation (Signed)
Bubble study done now.

## 2018-08-13 NOTE — Progress Notes (Signed)
Brief HPI: Diana Todd is an 68 y.o. female with past medical history of hypertension, anxiety and deep vein thrombosis (DVT) of popliteal vein of right lower extremity presenting to the ED 08/10/2018 with altered mental status.  Per patient's daughter who provides most of the history, patient was started on Xarelto 3 weeks ago for DVT however she was unable to tolerate it due to nausea and decreased p.o. intake.  Xarelto was therefore switched to Eliquis last Thursday 08/06/2018.  Per patient's daughter, she continued to have poor p.o. intake and was noted to be lethargic and altered.  Patient daughter brought her to her house however yesterday morning patient's condition had worsened therefore she was taken to the ED for further evaluation.On arrival to the ED, she was afebrile with blood pressure 148/71 mm Hg and pulse rate 86 beats/min. There were no focal neurological deficits; she was alert and but disoriented and unable to follow commands.  Initial labs revealed elevated WBC 17.1, platelets 143, sodium 125, potassium 2.8, albumin 3.4, alkaline phosphate 297, magnesium 1.7, ammonia 12, urinalysis negative for UTI.; an insidious infectious workup was initiated, including rapid HIV, RPR were ultimately negative. ECG showed sinus rhythm of 51 beats per minute and the chest X-ray showed bibasilar atelectasis. A non-contrast head CT showed no acute intracranial abnormality.  Follow-up MRI of the brain was obtained yesterday and showed numerous subcentimeter foci of reduced diffusion compatible with acute/early subacute infarction throughout the supratentorial brain and cerebellum concerning for embolic etiology.  Neurology was therefore consulted for further evaluation and management.  Subjective: No significant events noted overnight. Still with intermittent confusion but easily re-oriented. No new stroke or stroke like symptoms reported.  Objective: Current vital signs: BP (!) 156/77 (BP Location: Left  Arm)   Pulse (!) 116   Temp 97.9 F (36.6 C)   Resp 20   Ht 5\' 3"  (1.6 m)   Wt 103.9 kg   SpO2 94%   BMI 40.57 kg/m  Vital signs in last 24 hours: Temp:  [97.6 F (36.4 C)-97.9 F (36.6 C)] 97.9 F (36.6 C) (03/05 1439) Pulse Rate:  [91-120] 91 (03/05 1536) Resp:  [15-20] 16 (03/05 1536) BP: (140-170)/(69-85) 143/76 (03/05 1536) SpO2:  [94 %-100 %] 99 % (03/05 1536) Weight:  [103.9 kg] 103.9 kg (03/05 1439)  Intake/Output from previous day: 03/04 0701 - 03/05 0700 In: 2787.8 [I.V.:2787.8] Out: -  Intake/Output this shift: Total I/O In: 753.5 [P.O.:120; I.V.:633.5] Out: -  Nutritional status:  Diet Order            Diet NPO time specified  Diet effective now             Neurological Exam  Mental Status: Alert, oriented, thought content appropriate. Speech fluent without evidence of aphasia. Able to follow 3 step commands without difficulty. Attention span and concentration seemed appropriate  Cranial Nerves: II: Discs flat bilaterally; Visual fields grossly normal, pupils equal, round, reactive to light and accommodation III,IV, VI: ptosis not present, extra-ocular motions intact bilaterally V,VII: smile symmetric, facial light touch sensationintact VIII: hearing normal bilaterally IX,X: gag reflex present XI: bilateral shoulder shrug XII: midline tongue extension Motor: Right :Upper extremity 5/5Without pronator driftLeft: Upper extremity 5/5 without pronator drift Right:Lower extremity 5/5Left: Lower extremity 5/5 Tone and bulk:normal tone throughout; no atrophy noted Sensory: Pinprick and light touchintact bilaterally Deep Tendon Reflexes: 2+ and symmetric throughout Plantars: Right:muteLeft: mute Cerebellar: Finger-to-nosetesting intact bilaterally.Heel to shin testing normal bilaterally Gait: not tested due to safety concerns  Data Reviewed   Lab  Results: Basic Metabolic Panel: Recent Labs  Lab 08/10/18 0803 08/10/18 1327 08/11/18 0505 08/12/18 0454 08/12/18 2154 08/13/18 0449  NA 125* 129* 130* 134*  --  132*  K 2.8* 3.5 2.9* 3.7 3.5 3.7  CL 86* 92* 96* 102  --  100  CO2 24 25 21* 23  --  21*  GLUCOSE 119* 99 91 110*  --  113*  BUN 16 14 14 12   --  10  CREATININE 0.56 0.55 0.42* 0.45  --  0.42*  CALCIUM 9.1 8.0* 8.1* 8.1*  --  8.3*  MG 1.7  --  1.9 1.8 1.7  --     Liver Function Tests: Recent Labs  Lab 08/10/18 0803  AST 28  ALT 15  ALKPHOS 297*  BILITOT 1.5*  PROT 7.4  ALBUMIN 3.4*   No results for input(s): LIPASE, AMYLASE in the last 168 hours. Recent Labs  Lab 08/11/18 1346  AMMONIA 12    CBC: Recent Labs  Lab 08/10/18 0803 08/11/18 0505  WBC 17.1* 12.9*  HGB 13.2 11.1*  HCT 38.6 33.3*  MCV 83.0 84.9  PLT 143* 121*    Cardiac Enzymes: No results for input(s): CKTOTAL, CKMB, CKMBINDEX, TROPONINI in the last 168 hours.  Lipid Panel: Recent Labs  Lab 08/12/18 0454  CHOL 140  TRIG 107  HDL 43  CHOLHDL 3.3  VLDL 21  LDLCALC 76    CBG: Recent Labs  Lab 08/10/18 0755 08/10/18 1231  GLUCAP 93 33    Microbiology: Results for orders placed or performed during the hospital encounter of 08/10/18  CULTURE, BLOOD (ROUTINE X 2) w Reflex to ID Panel     Status: None (Preliminary result)   Collection Time: 08/10/18 11:33 AM  Result Value Ref Range Status   Specimen Description BLOOD LEFT ANTECUBITAL  Final   Special Requests   Final    BOTTLES DRAWN AEROBIC AND ANAEROBIC Blood Culture results may not be optimal due to an excessive volume of blood received in culture bottles   Culture   Final    NO GROWTH 3 DAYS Performed at [facility], [address] [place], [place] [address]    Report Status PENDING  Incomplete  CULTURE, BLOOD (ROUTINE X 2) w Reflex to ID Panel     Status: None (Preliminary result)   Collection Time: 08/10/18 11:36 AM  Result Value Ref Range Status    Specimen Description BLOOD RIGHT ANTECUBITAL  Final   Special Requests   Final    BOTTLES DRAWN AEROBIC AND ANAEROBIC Blood Culture adequate volume   Culture   Final    NO GROWTH 3 DAYS Performed at [facility], [address] [place], [place] [address]    Report Status PENDING  Incomplete    Coagulation Studies: No results for input(s): LABPROT, INR in the last 72 hours.  Imaging: Mr Brain Wo Contrast  Result Date: 08/11/2018 CLINICAL DATA:  68 y/o  F; acute toxic metabolic encephalopathy. EXAM: MRI HEAD WITHOUT CONTRAST TECHNIQUE: Coronal DWI, axial DWI, and axial T2 FLAIR weighted sequences were acquired. The patient was unable to continue and additional sequences were not acquired. COMPARISON:  None. FINDINGS: Motion degraded sequences. There are numerous subcentimeter foci of reduced diffusion scattered throughout the supratentorial brain and cerebellum predominantly within cortical in juxta cortical white matter compatible with acute/early subacute infarctions. The multiple infarctions are T2 FLAIR hyperintense. No associated mass effect. On the T2 FLAIR weighted sequence there is a  background of mild chronic microvascular ischemic changes and volume loss of the brain. No abnormal CSF signal to suggest extra-axial collection. No hydrocephalus, mass effect, or herniation. Left maxillary sinus disease and fluid level. IMPRESSION: 1. Motion degraded and limited study. 2. Numerous subcentimeter foci of reduced diffusion compatible acute/early subacute infarction throughout the supratentorial brain and cerebellum. Multiple vascular territories favors embolic phenomenon. No associated mass effect. 3. Background of mild chronic microvascular ischemic changes and volume loss of the brain. 4. Left maxillary sinus disease and fluid level. These results will be called to the ordering clinician or representative by the Radiologist Assistant, and communication documented in the PACS or zVision  Dashboard. Electronically Signed   By: Kristine Garbe M.D.   On: 08/11/2018 21:56   US Carotid Bilateral  Result Date: 08/12/2018 CLINICAL DATA:  CVA EXAM: BILATERAL CAROTID DUPLEX ULTRASOUND TECHNIQUE: Pearline Cables scale imaging, color Doppler and duplex ultrasound were performed of bilateral carotid and vertebral arteries in the neck. COMPARISON:  None. FINDINGS: Criteria: Quantification of carotid stenosis is based on velocity parameters that correlate the residual internal carotid diameter with NASCET-based stenosis levels, using the diameter of the distal internal carotid lumen as the denominator for stenosis measurement. The following velocity measurements were obtained: RIGHT ICA: 85 cm/sec CCA: 462 cm/sec SYSTOLIC ICA/CCA RATIO:  0.8 ECA: 103 cm/sec LEFT ICA: 91 cm/sec CCA: 93 cm/sec SYSTOLIC ICA/CCA RATIO:  1.0 ECA: 51 cm/sec RIGHT CAROTID ARTERY: Little if any plaque in the bulb. Low resistance internal carotid Doppler pattern. RIGHT VERTEBRAL ARTERY:  Antegrade. LEFT CAROTID ARTERY: Little if any plaque in the bulb. Low resistance internal carotid Doppler pattern. LEFT VERTEBRAL ARTERY:  Antegrade. IMPRESSION: Less than 50% stenosis in the right and left internal carotid arteries. Electronically Signed   By: Marybelle Killings M.D.   On: 08/12/2018 10:36    Medications:  I have reviewed the patient's current medications. Prior to Admission:  Medications Prior to Admission  Medication Sig Dispense Refill Last Dose  . amoxicillin-clavulanate (AUGMENTIN) 875-125 MG tablet Take 1 tablet by mouth 2 (two) times daily.   08/09/2018 at Unknown time  . apixaban (ELIQUIS) 5 MG TABS tablet Take 5 mg by mouth 2 (two) times daily.   08/09/2018 at 2330  . atenolol (TENORMIN) 50 MG tablet Take 50 mg by mouth daily.   08/09/2018 at 1000  . fluticasone (FLONASE) 50 MCG/ACT nasal spray Place 2 sprays into both nostrils daily.   08/09/2018 at Unknown time  . hydrocortisone 2.5 % cream Apply 1 application topically daily  as needed for rash.   Unknown at PRN  . lisinopril (PRINIVIL,ZESTRIL) 40 MG tablet Take 40 mg by mouth at bedtime.   08/09/2018 at 1130  . pantoprazole (PROTONIX) 40 MG tablet Take 40 mg by mouth daily as needed for nausea or heartburn.   Unknown at PRN  . PARoxetine (PAXIL) 30 MG tablet Take 60 mg by mouth daily.   08/09/2018 at 1000   Scheduled: . amoxicillin-clavulanate  1 tablet Oral Q12H  . apixaban  5 mg Oral BID  . atenolol  50 mg Oral Daily  . feeding supplement (ENSURE ENLIVE)  237 mL Oral TID BM  . fentaNYL      . fluticasone  2 spray Each Nare Daily  . ketamine (KETALAR) injection 10mg /mL (IV use)  1 mg/kg Intravenous Once  . lidocaine      . lisinopril  40 mg Oral Daily  . LORazepam  1 mg Intravenous Once  . midazolam      .  multivitamin with minerals  1 tablet Oral Daily  . sodium chloride flush        Assessment: 68 y.o. female with past medical history of hypertension, anxiety and deep vein thrombosis (DVT) of popliteal vein of right lower extremity presenting to the ED 08/10/2018 with altered mental status.  MRI of the brain reviewed and shows numerous subcentimeter foci of reduced diffusion compatible acute/early subacute infarction throughout the supratentorial brain and cerebellum. Multiple vascular territories involvement concerning for embolic etiology.  Patient was recently started on Xarelto for DVT which was switched to Eliquis on 08/06/2018.  Patient unsure if she was taking her medication correctly. States she may have been taking it once a day? Concerns for paradoxical embolism, septic emboli given elevated WBC on presentation even though have low suspicion for sepsis. Echocardiogram shows evidence of cardiomyopathy with EF 35-40%, no source of cardiac emboli noted. EEG did show evidence of epileptiform discharges.  Plan: 1. Continue medical management with Eliquis 5 mg BID 2. PT consult, OT consult, Speech consult 3. TEE.  4.Telemetry monitoring 5. Frequent neuro  checks  This patient was staffed with Dr. Irish Elders, Alease Frame who personally evaluated patient, reviewed documentation and agreed with assessment and plan of care as above.  Rufina Falco, DNP, FNP-BC Board certified Nurse Practitioner Neurology Department   LOS: 3 days   08/13/2018  3:38 PM

## 2018-08-13 NOTE — Care Management (Signed)
Physical and occupational therapy recommending inpatient acute rehabilitation. Discussed this transition of care plan with [name] at the bedside.  States she prefers to go home with home heath/physical therapy when discharged. Scheduled for a TEE tomorrow Diana Ammons RN MSN [place] [facility] [phone]

## 2018-08-13 NOTE — Progress Notes (Addendum)
Pt running tachy with PVC's. No hx noted in summary however pt does take atenolol at home which was not continued at admission. Pt is acute stroke pt. MD paged to notify and consult on POC. Given pt is stroke pt and often times BP is allowed to run high for reperfusion of infarct area on call MD has stated that he will pass the information along to attending provider. Will continue to monitor. Pt currently reporting no pain and is asymptomatic.

## 2018-08-13 NOTE — Progress Notes (Signed)
SLP Cancellation Note  Patient Details Name: Diana Todd MRN: [id number] DOB: 1950/10/27   Cancelled treatment:       Reason Eval/Treat Not Completed: Patient at procedure or test/unavailable(chart reviewed; pt is NPO for procedure today per NSG). Per chart notes, no acute events overnight. Patient is doing well this morning and denies headache or neurological deficits.  ST services will f/u w/ toleration of diet tomorrow post procedure when pt is back on her diet. Recommended aspiration precautions to NSG.    Orinda Kenner, MS, CCC-SLP Watson,Katherine 08/13/2018, 12:20 PM

## 2018-08-13 NOTE — Progress Notes (Signed)
Physical Therapy Treatment Patient Details Name: Diana Todd MRN: [id number] DOB: 05/11/1951 Today's Date: 08/13/2018    History of Present Illness Pt is a 68 y.o. female presenting to hospital 08/10/18 with AMS and facial pain.  Pt admitted with severe hyponatremia with AMS, hypomagnesium, and hypokalemia.  Agitation noted during hospital stay. MRI+ for acute/early subacute infarction throughout the supratentorial brain and cerebellum. PMH includes anxiety, depression, htn, DVT, and reported 50# weight loss since November.    PT Comments    Pt in bed.  HR noted to be elevated this am but within limits for therapy at rest.  Orthostatic BP's were taken during session as pt had continued dizziness x 2 PT sessions.  BP's were stable in supine, sitting and standing.  HR 115 at rest but increased to 144 once standing.  While she was able to stand with min a x 1 today, gait was not performed due to HR.  She was assisted back to bed and RN was notified.  HR decreased to 122 once supine.  Overall cognition is much clearer today.  Based on treatment yesterday where pt demonstrated need for +2 assist for safety with gait, poor walker management and use, general balance and safety deficits and discussion with OT, will update recommendations to CIR to screen for appropriateness.  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  OT placed CIR recommendation yesterday.  Discussed with Hassan Rowan from Care Management.     Follow Up Recommendations  CIR;Other (comment)  If pt is not appropriate for CIR HHPT with 24 hour support of family would be appropriate.      Equipment Recommendations  Rolling walker with 5" wheels    Recommendations for Other Services       Precautions / Restrictions Precautions Precautions: Fall Precaution Comments: monitor HR Restrictions Weight Bearing Restrictions: No    Mobility  Bed Mobility Overal bed mobility:  Needs Assistance Bed Mobility: Supine to Sit;Sit to Supine     Supine to sit: Supervision Sit to supine: Supervision      Transfers Overall transfer level: Needs assistance Equipment used: Rolling walker (2 wheeled) Transfers: Sit to/from Stand;Lateral/Scoot Transfers Sit to Stand: Min assist        Lateral/Scoot Transfers: Min assist    Ambulation/Gait             General Gait Details: Deferred due to HR 144 with standing.   Stairs             Wheelchair Mobility    Modified Rankin (Stroke Patients Only)       Balance Overall balance assessment: Needs assistance Sitting-balance support: No upper extremity supported;Feet supported Sitting balance-Leahy Scale: Fair     Standing balance support: Bilateral upper extremity supported Standing balance-Leahy Scale: Poor                              Cognition Arousal/Alertness: Awake/alert Behavior During Therapy: WFL for tasks assessed/performed Overall Cognitive Status: Impaired/Different from baseline                                 General Comments: Pt more orientated today.  Some irregularities remain but overal much clearer.      Exercises      General Comments        Pertinent Vitals/Pain Pain  Assessment: No/denies pain    Home Living                      Prior Function            PT Goals (current goals can now be found in the care plan section) Progress towards PT goals: Progressing toward goals    Frequency    7X/week      PT Plan      Co-evaluation              AM-PAC PT "6 Clicks" Mobility   Outcome Measure  Help needed turning from your back to your side while in a flat bed without using bedrails?: A Little Help needed moving from lying on your back to sitting on the side of a flat bed without using bedrails?: A Little Help needed moving to and from a bed to a chair (including a wheelchair)?: A Little Help needed standing  up from a chair using your arms (e.g., wheelchair or bedside chair)?: A Little Help needed to walk in hospital room?: A Little Help needed climbing 3-5 steps with a railing? : A Little 6 Click Score: 18    End of Session Equipment Utilized During Treatment: Gait belt Activity Tolerance: Treatment limited secondary to medical complications (Comment) Patient left: with call bell/phone within reach;with bed alarm set;in bed Nurse Communication: Other (comment)       Time: 5885-0277 PT Time Calculation (min) (ACUTE ONLY): 24 min  Charges:  $Therapeutic Activity: 23-37 mins                     Chesley Noon, PTA 08/13/18, 11:09 AM

## 2018-08-13 NOTE — Progress Notes (Signed)
[facility] NAME: Diana Todd    MR#:  [id number]  DATE OF BIRTH:  [birth year]  SUBJECTIVE:  No acute events overnight.  Patient is doing well this morning.  Denies headache or neurological deficits.  REVIEW OF SYSTEMS:    Review of Systems  Constitutional: Negative for fever, chills weight loss Generalized poor p.o. intake HENT: Negative for ear pain, nosebleeds, congestion, facial swelling, rhinorrhea, neck pain, neck stiffness and ear discharge.   Respiratory: Negative for cough, shortness of breath, wheezing  Cardiovascular: Negative for chest pain, palpitations and leg swelling.  Gastrointestinal: Negative for heartburn, abdominal pain, vomiting, diarrhea or consitpation Genitourinary: Negative for dysuria, urgency, frequency, hematuria Musculoskeletal: Negative for back pain or joint pain Neurological: Negative for dizziness, seizures, syncope, focal weakness,  numbness and headaches.  Hematological: Does not bruise/bleed easily.  Psychiatric/Behavioral: Negative for hallucinations, confusion, dysphoric mood    Tolerating Diet: yes      DRUG ALLERGIES:  No Known Allergies  VITALS:  Blood pressure (!) 156/77, pulse (!) 116, temperature 97.9 F (36.6 C), resp. rate 20, height 5\' 3"  (1.6 m), weight 103.9 kg, SpO2 94 %.  PHYSICAL EXAMINATION:  Constitutional: Appears well-developed and well-nourished. No distress. HENT: Normocephalic. Marland Kitchen Oropharynx is clear and moist.  Eyes: Conjunctivae and EOM are normal. PERRLA, no scleral icterus.  Neck: Normal ROM. Neck supple. No JVD. No tracheal deviation. CVS: RRR, S1/S2 +, no murmurs, no gallops, no carotid bruit.  Pulmonary: Effort and breath sounds normal, no stridor, rhonchi, wheezes, rales.  Abdominal: Soft. BS +,  no distension, tenderness, rebound or guarding.  Musculoskeletal: Normal range of motion. No edema and no tenderness.  Neuro: Alert. CN 2-12 grossly intact.  No focal deficits. Skin: Skin is warm and dry. No rash noted. Psychiatric: Normal mood and affect.      LABORATORY PANEL:   CBC Recent Labs  Lab 08/11/18 0505  WBC 12.9*  HGB 11.1*  HCT 33.3*  PLT 121*   ------------------------------------------------------------------------------------------------------------------  Chemistries  Recent Labs  Lab 08/10/18 0803  08/12/18 2154 08/13/18 0449  NA 125*   < >  --  132*  K 2.8*   < > 3.5 3.7  CL 86*   < >  --  100  CO2 24   < >  --  21*  GLUCOSE 119*   < >  --  113*  BUN 16   < >  --  10  CREATININE 0.56   < >  --  0.42*  CALCIUM 9.1   < >  --  8.3*  MG 1.7   < > 1.7  --   AST 28  --   --   --   ALT 15  --   --   --   ALKPHOS 297*  --   --   --   BILITOT 1.5*  --   --   --    < > = values in this interval not displayed.   ------------------------------------------------------------------------------------------------------------------  Cardiac Enzymes No results for input(s): TROPONINI in the last 168 hours. ------------------------------------------------------------------------------------------------------------------  RADIOLOGY:  Mr Brain 59 Contrast  Result Date: 08/11/2018 CLINICAL DATA:  68 y/o  F; acute toxic metabolic encephalopathy. EXAM: MRI HEAD WITHOUT CONTRAST TECHNIQUE: Coronal DWI, axial DWI, and axial T2 FLAIR weighted sequences were acquired. The patient was unable to continue and additional sequences were not acquired. COMPARISON:  None. FINDINGS: Motion degraded sequences. There are numerous subcentimeter foci of reduced diffusion  scattered throughout the supratentorial brain and cerebellum predominantly within cortical in juxta cortical white matter compatible with acute/early subacute infarctions. The multiple infarctions are T2 FLAIR hyperintense. No associated mass effect. On the T2 FLAIR weighted sequence there is a background of mild chronic microvascular ischemic changes and volume loss of the  brain. No abnormal CSF signal to suggest extra-axial collection. No hydrocephalus, mass effect, or herniation. Left maxillary sinus disease and fluid level. IMPRESSION: 1. Motion degraded and limited study. 2. Numerous subcentimeter foci of reduced diffusion compatible acute/early subacute infarction throughout the supratentorial brain and cerebellum. Multiple vascular territories favors embolic phenomenon. No associated mass effect. 3. Background of mild chronic microvascular ischemic changes and volume loss of the brain. 4. Left maxillary sinus disease and fluid level. These results will be called to the ordering clinician or representative by the Radiologist Assistant, and communication documented in the PACS or zVision Dashboard. Electronically Signed   By: Kristine Garbe M.D.   On: 08/11/2018 21:56   US Carotid Bilateral  Result Date: 08/12/2018 CLINICAL DATA:  CVA EXAM: BILATERAL CAROTID DUPLEX ULTRASOUND TECHNIQUE: Pearline Cables scale imaging, color Doppler and duplex ultrasound were performed of bilateral carotid and vertebral arteries in the neck. COMPARISON:  None. FINDINGS: Criteria: Quantification of carotid stenosis is based on velocity parameters that correlate the residual internal carotid diameter with NASCET-based stenosis levels, using the diameter of the distal internal carotid lumen as the denominator for stenosis measurement. The following velocity measurements were obtained: RIGHT ICA: 85 cm/sec CCA: 277 cm/sec SYSTOLIC ICA/CCA RATIO:  0.8 ECA: 103 cm/sec LEFT ICA: 91 cm/sec CCA: 93 cm/sec SYSTOLIC ICA/CCA RATIO:  1.0 ECA: 51 cm/sec RIGHT CAROTID ARTERY: Little if any plaque in the bulb. Low resistance internal carotid Doppler pattern. RIGHT VERTEBRAL ARTERY:  Antegrade. LEFT CAROTID ARTERY: Little if any plaque in the bulb. Low resistance internal carotid Doppler pattern. LEFT VERTEBRAL ARTERY:  Antegrade. IMPRESSION: Less than 50% stenosis in the right and left internal carotid arteries.  Electronically Signed   By: Marybelle Killings M.D.   On: 08/12/2018 10:36     ASSESSMENT AND PLAN:   68 year old female with a history of depression, hypertension and DVT on Eliquis who presented with altered mental status.  1.  Acute metabolic encephalopathy: Encephalopathy multifactorial due to electrolyte abnormalities as well as Numerous subcentimeter foci of reduced diffusion compatible acute/early subacute infarction throughout the supratentorial brain and cerebellum. LDL 76 Electrolytes are being repleted PRN Echo with bubble study shows no source of thrombus and therefore patient will need to have a TEE for tomorrow.   Carotid Doppler shows no hemodynamically significant stenosis EEG was unremarkable  Continue telemetry Continue neurochecks  2.  Hyponatremia: This has improved with IV fluids 3.  Hypertension: Continue lisinopril and atenolol.  4.  History of DVT: Continue Eliquis  5.  Acute sinusitis: Continue Augmentin for 3 more days.  6.  PVCs: Restart atenolol Replete magnesium Management plans discussed with the patient and she  is in agreement.  Discussed with neurology  CODE STATUS: full  TOTAL TIME TAKING CARE OF THIS PATIENT: 19minutes.     POSSIBLE D/C 1-2 days, DEPENDING ON CLINICAL CONDITION.   Liberty Stead M.D on 08/13/2018 at 10:58 AM  Between 7am to 6pm - Pager - [phone] After 6pm go to [email or web address] - password [email or web address] [facility]  Office  [phone]  CC: Primary care physician; System, Provider Not In  Note: This dictation was prepared with Dragon dictation along with smaller phrase technology. Any  transcriptional errors that result from this process are unintentional.

## 2018-08-13 NOTE — Sedation Documentation (Signed)
O2 increased to 4L/[place]. O2 sats. From 90 to 95%

## 2018-08-13 NOTE — Consult Note (Signed)
Cardiology Consultation:   Patient ID: [name] MRN: [id number]; DOB: 05-09-1951  Admit date: 08/10/2018 Date of Consult: 08/13/2018  Primary Care Provider: System, Provider Not In Primary Cardiologist: New to [facility] Reason for consult: Stroke, management of anticoagulation, cardiomyopathy Physician requesting consult: Rufina Falco, neurology   Patient Profile:   Diana Todd is a 68 y.o. female with a hx of  hypertension,  anxiety  deep vein thrombosis (DVT) of popliteal vein of right lower extremity on warfarin presenting to the ED 3/2/2020with altered mental status.   History of Present Illness:   Diana Todd is a poor historian, most of the history obtained from the patient's daughter Daughter reports that she has had poor p.o. intake over the past month Initially felt it was from [place] and this was changed to Eliquis last Thursday, February 27 Despite the medication change she has continued to be lethargic, more altered, poor p.o. intake  Due to worsening condition daughter brought her to the emergency room In the ER was disoriented, unable to follow commands Potassium 2.8, WBC 17   non-contrast head CT showedno acute intracranial abnormality.  Follow-up MRI of the brain  showed numerous subcentimeter foci of reduced diffusion compatible with acute/early subacute infarction throughout the supratentorial brain and cerebellum concerning for embolic etiology.   Neurology has been following the patient  Transthoracic echo showing moderately depressed ejection fraction 35% with anterior, anteroseptal and apical hypokinesis, dilated left atrium Bubble study was negative  Request was made for transesophageal echo This was a difficult procedure, Unable to visualize the left atrial appendage well, though Doppler velocities through the appendage were normal Left atrium moderate to severely enlarged, unable to exclude spontaneous contrast LV function was  moderate to severely depressed with regions of wall motion abnormality discussed above, some concern for regions of akinesis particularly in the apex   Past Medical History:  Diagnosis Date  . Anxiety   . DVT (deep venous thrombosis) ([place])   . Hypertension     Past Surgical History:  Procedure Laterality Date  . NO PAST SURGERIES       Home Medications:  Prior to Admission medications   Medication Sig Start Date End Date Taking? Authorizing Provider  amoxicillin-clavulanate (AUGMENTIN) 875-125 MG tablet Take 1 tablet by mouth 2 (two) times daily. 08/06/18 08/13/18 Yes [provider]  apixaban (ELIQUIS) 5 MG TABS tablet Take 5 mg by mouth 2 (two) times daily. 07/28/18 08/27/18 Yes [provider]  atenolol (TENORMIN) 50 MG tablet Take 50 mg by mouth daily. 07/15/18  Yes [provider]  fluticasone (FLONASE) 50 MCG/ACT nasal spray Place 2 sprays into both nostrils daily.   Yes [provider]  hydrocortisone 2.5 % cream Apply 1 application topically daily as needed for rash.   Yes [provider]  lisinopril (PRINIVIL,ZESTRIL) 40 MG tablet Take 40 mg by mouth at bedtime. 07/15/18  Yes [provider]  pantoprazole (PROTONIX) 40 MG tablet Take 40 mg by mouth daily as needed for nausea or heartburn. 07/27/18  Yes [provider]  PARoxetine (PAXIL) 30 MG tablet Take 60 mg by mouth daily.   Yes [provider]    Inpatient Medications: Scheduled Meds: . amoxicillin-clavulanate  1 tablet Oral Q12H  . apixaban  5 mg Oral BID  . atenolol  50 mg Oral Daily  . feeding supplement (ENSURE ENLIVE)  237 mL Oral TID BM  . fentaNYL      . fluticasone  2 spray Each Nare Daily  .  ketamine (KETALAR) injection 10mg /mL (IV use)  1 mg/kg Intravenous Once  . lidocaine      . lisinopril  40 mg Oral Daily  . LORazepam  1 mg Intravenous Once  . midazolam      . multivitamin with minerals  1 tablet Oral Daily  . sodium chloride flush         Continuous Infusions: . sodium chloride    . 0.9 % NaCl with KCl 20 mEq / L 75 mL/hr at 08/13/18 1324   PRN Meds: acetaminophen **OR** acetaminophen, haloperidol lactate, hydrALAZINE, ondansetron **OR** ondansetron (ZOFRAN) IV, pantoprazole  Allergies:    Allergies  Allergen Reactions  . Macrodantin [Nitrofurantoin Macrocrystal]     "welts" per pt.     Social History:   Social History   Socioeconomic History  . Marital status: Unknown    Spouse name: Not on file  . Number of children: Not on file  . Years of education: Not on file  . Highest education level: Not on file  Occupational History  . Not on file  Social Needs  . Financial resource strain: Not on file  . Food insecurity:    Worry: Not on file    Inability: Not on file  . Transportation needs:    Medical: Not on file    Non-medical: Not on file  Tobacco Use  . Smoking status: Never Smoker  . Smokeless tobacco: Never Used  Substance and Sexual Activity  . Alcohol use: Never    Frequency: Never  . Drug use: Never  . Sexual activity: Not on file  Lifestyle  . Physical activity:    Days per week: Not on file    Minutes per session: Not on file  . Stress: Not on file  Relationships  . Social connections:    Talks on phone: Not on file    Gets together: Not on file    Attends religious service: Not on file    Active member of club or organization: Not on file    Attends meetings of clubs or organizations: Not on file    Relationship status: Not on file  . Intimate partner violence:    Fear of current or ex partner: Not on file    Emotionally abused: Not on file    Physically abused: Not on file    Forced sexual activity: Not on file  Other Topics Concern  . Not on file  Social History Narrative  . Not on file    Family History:    Family History  Problem Relation Age of Onset  . Breast cancer Mother   . AAA (abdominal aortic aneurysm) Father      ROS:  Please see the history of present  illness.  Review of Systems  Constitutional: Negative.   Respiratory: Negative.   Cardiovascular: Negative.   Gastrointestinal: Negative.   Musculoskeletal: Negative.   Neurological: Negative.   Psychiatric/Behavioral: Negative.   All other systems reviewed and are negative.  Patient denied any issues though was somewhat disoriented  Physical Exam/Data:   Vitals:   08/12/18 1600 08/12/18 1947 08/13/18 0326 08/13/18 1638  BP: (!) 150/77 (!) 170/77 (!) 156/77 132/71  Pulse: (!) 102 (!) 113 (!) 116 87  Resp: 20 17 20 16   Temp: 97.6 F (36.4 C) 97.9 F (36.6 C) 97.9 F (36.6 C) 98 F (36.7 C)  TempSrc:    Oral  SpO2: 97% 97% 94% 100%  Weight:      Height:  Intake/Output Summary (Last 24 hours) at 08/13/2018 1730 Last data filed at 08/13/2018 1324 Gross per 24 hour  Intake 3541.24 ml  Output -  Net 3541.24 ml   Last 3 Weights 08/13/2018 08/10/2018  Weight (lbs) 229 lb 229 lb  Weight (kg) 103.874 kg 103.874 kg     Body mass index is 40.57 kg/m.  General:  Well nourished, well developed, in no acute distress, conversant but answering in single word answers HEENT: normal Lymph: no adenopathy Neck: no JVD Endocrine:  No thryomegaly Vascular: No carotid bruits; FA pulses 2+ bilaterally without bruits  Cardiac:  normal S1, S2; RRR; 2/6 diastolic and systolic  murmur appreciated left sternal border  Lungs:  clear to auscultation bilaterally, no wheezing, rhonchi or rales  Abd: soft, nontender, no hepatomegaly  Ext: no edema Musculoskeletal:  No deformities, BUE and BLE strength normal and equal Skin: warm and dry  Neuro: Full exam not performed Psych: Quiet, moderately confused  EKG:  The EKG was personally reviewed and demonstrates: Shows normal sinus rhythm rate 94 bpm T wave abnormality anterior precordial leads concerning for ischemia, nonspecific ST and T wave abnormality 1 and aVL, PVC noted Telemetry:  Telemetry was personally reviewed and demonstrates:  Normal  sinus rhythm  Relevant CV Studies: Echocardiogram performed yesterday  1. The left ventricle has moderately reduced systolic function, with an ejection fraction of 35-40%. The cavity size was normal. There is moderately increased left ventricular wall thickness. Left ventricular diastolic Doppler parameters are consistent  with impaired relaxation Elevated mean left atrial pressure.  2. The right ventricle has normal systolic function. The cavity was normal. There is no increase in right ventricular wall thickness. Right ventricular systolic pressure could not be assessed.  3. Left atrial size was moderately dilated.  4. The mitral valve is degenerative. Mild thickening of the mitral valve leaflet.  5. The aortic valve was not well visualized.  6. No evidence of right to left shunt by bubble study. Interatrial septum is not well visualized by color Doppler.  7. Severe hypokinesis of the left ventricular anteroseptal wall, apical segment and anterior wall.  Laboratory Data:  Chemistry Recent Labs  Lab 08/11/18 0505 08/12/18 0454 08/12/18 2154 08/13/18 0449  NA 130* 134*  --  132*  K 2.9* 3.7 3.5 3.7  CL 96* 102  --  100  CO2 21* 23  --  21*  GLUCOSE 91 110*  --  113*  BUN 14 12  --  10  CREATININE 0.42* 0.45  --  0.42*  CALCIUM 8.1* 8.1*  --  8.3*  GFRNONAA >60 >60  --  >60  GFRAA >60 >60  --  >60  ANIONGAP 13 9  --  11    Recent Labs  Lab 08/10/18 0803  PROT 7.4  ALBUMIN 3.4*  AST 28  ALT 15  ALKPHOS 297*  BILITOT 1.5*   Hematology Recent Labs  Lab 08/10/18 0803 08/11/18 0505  WBC 17.1* 12.9*  RBC 4.65 3.92  HGB 13.2 11.1*  HCT 38.6 33.3*  MCV 83.0 84.9  MCH 28.4 28.3  MCHC 34.2 33.3  RDW 13.7 13.7  PLT 143* 121*   Cardiac EnzymesNo results for input(s): TROPONINI in the last 168 hours. No results for input(s): TROPIPOC in the last 168 hours.  BNPNo results for input(s): BNP, PROBNP in the last 168 hours.  DDimer No results for input(s): DDIMER in the last  168 hours.  Radiology/Studies:  Dg Chest 2 View  Result Date: 08/10/2018 CLINICAL  DATA:  Confusion EXAM: CHEST - 2 VIEW COMPARISON:  None. FINDINGS: Lungs are under aerated with bibasilar atelectasis. The heart is a accentuated by low volumes. It is likely normal in size. No pneumothorax or pleural effusion. Normal vascularity. IMPRESSION: Bibasilar atelectasis. Electronically Signed   By: Marybelle Killings M.D.   On: 08/10/2018 08:48   Ct Head Wo Contrast  Result Date: 08/10/2018 CLINICAL DATA:  Encephalopathy EXAM: CT HEAD WITHOUT CONTRAST TECHNIQUE: Contiguous axial images were obtained from the base of the skull through the vertex without intravenous contrast. COMPARISON:  11/21/2017 FINDINGS: Brain: Stable old lacunar infarct in the left internal capsule. No acute intracranial abnormality. Specifically, no hemorrhage, hydrocephalus, mass lesion, acute infarction, or significant intracranial injury. Vascular: No hyperdense vessel or unexpected calcification. Skull: No acute calvarial abnormality. Sinuses/Orbits: Mucosal thickening and air-fluid level in the left maxillary sinus. Other: None IMPRESSION: No acute intracranial abnormality. Stable old left internal capsule lacunar infarct. Acute on chronic left maxillary sinusitis. Electronically Signed   By: Rolm Baptise M.D.   On: 08/10/2018 09:57   Mr Brain Wo Contrast  Result Date: 08/11/2018 CLINICAL DATA:  68 y/o  F; acute toxic metabolic encephalopathy. EXAM: MRI HEAD WITHOUT CONTRAST TECHNIQUE: Coronal DWI, axial DWI, and axial T2 FLAIR weighted sequences were acquired. The patient was unable to continue and additional sequences were not acquired. COMPARISON:  None. FINDINGS: Motion degraded sequences. There are numerous subcentimeter foci of reduced diffusion scattered throughout the supratentorial brain and cerebellum predominantly within cortical in juxta cortical white matter compatible with acute/early subacute infarctions. The multiple infarctions  are T2 FLAIR hyperintense. No associated mass effect. On the T2 FLAIR weighted sequence there is a background of mild chronic microvascular ischemic changes and volume loss of the brain. No abnormal CSF signal to suggest extra-axial collection. No hydrocephalus, mass effect, or herniation. Left maxillary sinus disease and fluid level. IMPRESSION: 1. Motion degraded and limited study. 2. Numerous subcentimeter foci of reduced diffusion compatible acute/early subacute infarction throughout the supratentorial brain and cerebellum. Multiple vascular territories favors embolic phenomenon. No associated mass effect. 3. Background of mild chronic microvascular ischemic changes and volume loss of the brain. 4. Left maxillary sinus disease and fluid level. These results will be called to the ordering clinician or representative by the Radiologist Assistant, and communication documented in the PACS or zVision Dashboard. Electronically Signed   By: Kristine Garbe M.D.   On: 08/11/2018 21:56   US Carotid Bilateral  Result Date: 08/12/2018 CLINICAL DATA:  CVA EXAM: BILATERAL CAROTID DUPLEX ULTRASOUND TECHNIQUE: Pearline Cables scale imaging, color Doppler and duplex ultrasound were performed of bilateral carotid and vertebral arteries in the neck. COMPARISON:  None. FINDINGS: Criteria: Quantification of carotid stenosis is based on velocity parameters that correlate the residual internal carotid diameter with NASCET-based stenosis levels, using the diameter of the distal internal carotid lumen as the denominator for stenosis measurement. The following velocity measurements were obtained: RIGHT ICA: 85 cm/sec CCA: 875 cm/sec SYSTOLIC ICA/CCA RATIO:  0.8 ECA: 103 cm/sec LEFT ICA: 91 cm/sec CCA: 93 cm/sec SYSTOLIC ICA/CCA RATIO:  1.0 ECA: 51 cm/sec RIGHT CAROTID ARTERY: Little if any plaque in the bulb. Low resistance internal carotid Doppler pattern. RIGHT VERTEBRAL ARTERY:  Antegrade. LEFT CAROTID ARTERY: Little if any plaque  in the bulb. Low resistance internal carotid Doppler pattern. LEFT VERTEBRAL ARTERY:  Antegrade. IMPRESSION: Less than 50% stenosis in the right and left internal carotid arteries. Electronically Signed   By: Marybelle Killings M.D.   On: 08/12/2018 10:36  Assessment and Plan:   1. Stroke Embolic per the MRI results, multiple foci Etiology unclear though concerning for mural thrombi from cardiomyopathy Left atrial appendage not well visualized and unable to exclude thrombus in the appendage -Long discussion with patient's daughter, she is on Eliquis 5 twice daily the medication compliance of some concern Possible that she has developed strokes secondary to noncompliance with her anticoagulation Previously was on Xarelto, there was concern of anorexia.  This is not improved by changing to Eliquis per the daughter --Ideally should be on warfarin for possible LV thrombus, and history of DVT. Daughter very concerned about medication compliance, given recent mental status changes -We will defer decision to medicine service If she is going to rehab and will have visiting home nurse eventually or family can get involved with medications, might be a candidate for warfarin --medication compliance likely an issue leading to strokes, rather than Eliquis failure  2) cardiomyopathy Echocardiogram transthoracic and transesophageal echo concerning for underlying coronary artery disease, unable to exclude prior anterior MI -In her current state, moderately confused, may not be a good candidate for ischemic work-up -Total cholesterol relatively well controlled 140, diabetes well controlled with hemoglobin A1c less than 6 -No prior troponin available --I would suggest recovery from her strokes , likely requiring rehab,  Depending on her recovery, outpatient follow-up to determine need for ischemic work-up  3) acute metabolic encephalopathy Secondary to multiple embolic strokes No need for reveal device as she  will either be on warfarin or Eliquis Carotid ultrasound unremarkable, nonobstructive disease  Long discussion with patient's daughter concerning above, Discussed anticoagulation options, cardiomyopathy, potential for underlying coronary disease Possible work-up available in the future  Total encounter time more than 110 minutes  Greater than 50% was spent in counseling and coordination of care with the patient    For questions or updates, please contact [facility] Please consult [email or web address] for contact info under     Signed, Ida Rogue, MD  08/13/2018 5:30 PM

## 2018-08-14 DIAGNOSIS — I639 Cerebral infarction, unspecified: Secondary | ICD-10-CM

## 2018-08-14 DIAGNOSIS — F331 Major depressive disorder, recurrent, moderate: Secondary | ICD-10-CM

## 2018-08-14 MED ORDER — ENSURE ENLIVE PO LIQD: 237.0000 mL | Freq: Three times a day (TID) | ORAL | 12 refills | Status: AC

## 2018-08-14 MED ORDER — AMOXICILLIN-POT CLAVULANATE 875-125 MG PO TABS: 1.0000 | ORAL_TABLET | Freq: Two times a day (BID) | ORAL | 0 refills | Status: AC

## 2018-08-14 NOTE — Discharge Summary (Signed)
[facility] [place] NAME: Diana Todd    MR#:  [id number]  DATE OF BIRTH:  [birth year]  DATE OF ADMISSION:  08/10/2018 ADMITTING PHYSICIAN: Loletha Grayer, MD  DATE OF DISCHARGE: 08/14/2018  PRIMARY CARE PHYSICIAN: System, Provider Not In    ADMISSION DIAGNOSIS:  Hypokalemia [E87.6] Hyponatremia [E87.1] Encephalopathy acute [G93.40]  DISCHARGE DIAGNOSIS:  Active Problems:   Hyponatremia CVA  SECONDARY DIAGNOSIS:   Past Medical History:  Diagnosis Date  . Anxiety   . DVT (deep venous thrombosis) ([place])   . Hypertension     HOSPITAL COURSE:    68 year old female with a history of depression, hypertension and DVT on Eliquis who presented with altered mental status.  1.  Acute metabolic encephalopathy: Encephalopathy multifactorial due to electrolyte abnormalities as well as Numerous subcentimeter foci of reduced diffusion compatible acute/early subacute infarction throughout the supratentorial brain and cerebellum. LDL 76 Electrolytes are being repleted PRN Echo with bubble study shows no source of thrombus  TEE was inconclusive for thrombus  carotid Doppler shows no hemodynamically significant stenosis EEG was unremarkable She wil be discharged on Eliquis and states she will take this as prescribed/  2.  Hyponatremia: This has improved with IV fluids 3.  Hypertension: Continue lisinopril and atenolol.  4.  History of DVT: Continue anticoagulation  5.  Acute sinusitis: Continue Augmentin for 2 more days.  6. New cardiomyopathy Echocardiogram transthoracic and transesophageal echo concerning for underlying coronary artery disease, unable to exclude prior anterior MI As per cardiology depending on her recovery, outpatient follow-up to determine need for ischemic work-up   DISCHARGE CONDITIONS AND DIET:   Stable  Dysphagia level 3(w/ Minced meats, gravy); Thin liquids. Aspiration precautions; support and  supervision at meals. Reduce distractions at meals.  Medication Administration: Whole meds with puree(Crush if needed)   CONSULTS OBTAINED:  Treatment Team:  Catarina Hartshorn, MD Alexis Goodell, MD End, Harrell Gave, MD  DRUG ALLERGIES:   Allergies  Allergen Reactions  . Macrodantin [Nitrofurantoin Macrocrystal]     "welts" per pt.     DISCHARGE MEDICATIONS:   Allergies as of 08/14/2018      Reactions   Macrodantin [nitrofurantoin Macrocrystal]    "welts" per pt.       Medication List    TAKE these medications   amoxicillin-clavulanate 875-125 MG tablet Commonly known as:  AUGMENTIN Take 1 tablet by mouth every 12 (twelve) hours for 3 days. What changed:  when to take this   apixaban 5 MG Tabs tablet Commonly known as:  ELIQUIS Take 5 mg by mouth 2 (two) times daily.   atenolol 50 MG tablet Commonly known as:  TENORMIN Take 50 mg by mouth daily.   feeding supplement (ENSURE ENLIVE) Liqd Take 237 mLs by mouth 3 (three) times daily between meals.   fluticasone 50 MCG/ACT nasal spray Commonly known as:  FLONASE Place 2 sprays into both nostrils daily.   hydrocortisone 2.5 % cream Apply 1 application topically daily as needed for rash.   lisinopril 40 MG tablet Commonly known as:  PRINIVIL,ZESTRIL Take 40 mg by mouth at bedtime.   pantoprazole 40 MG tablet Commonly known as:  PROTONIX Take 40 mg by mouth daily as needed for nausea or heartburn.   PARoxetine 30 MG tablet Commonly known as:  PAXIL Take 60 mg by mouth daily.            Durable Medical Equipment  (From admission, onward)  Start     Ordered   08/14/18 1003  For home use only DME Walker  [facility])  Once    Question:  Patient needs a walker to treat with the following condition  Answer:  CVA (cerebral vascular accident) ([place])   08/14/18 1005            Today   CHIEF COMPLAINT:  Patient doing well this am no issues  Does not want to go to rehab wants to go  home VITAL SIGNS:  Blood pressure (!) 147/83, pulse (!) 146, temperature 98.7 F (37.1 C), resp. rate 18, height 5\' 3"  (1.6 m), weight 103.9 kg, SpO2 94 %.   REVIEW OF SYSTEMS:  Review of Systems  Constitutional: Negative.  Negative for chills, fever and malaise/fatigue.  HENT: Negative.  Negative for ear discharge, ear pain, hearing loss, nosebleeds and sore throat.   Eyes: Negative.  Negative for blurred vision and pain.  Respiratory: Negative.  Negative for cough, hemoptysis, shortness of breath and wheezing.   Cardiovascular: Negative.  Negative for chest pain, palpitations and leg swelling.  Gastrointestinal: Negative.  Negative for abdominal pain, blood in stool, diarrhea, nausea and vomiting.  Genitourinary: Negative.  Negative for dysuria.  Musculoskeletal: Negative.  Negative for back pain.  Skin: Negative.   Neurological: Negative for dizziness, tremors, speech change, focal weakness, seizures and headaches.  Endo/Heme/Allergies: Negative.  Does not bruise/bleed easily.  Psychiatric/Behavioral: Negative.  Negative for depression, hallucinations and suicidal ideas.     PHYSICAL EXAMINATION:  GENERAL:  68 y.o.-year-old patient lying in the bed with no acute distress.  NECK:  Supple, no jugular venous distention. No thyroid enlargement, no tenderness.  LUNGS: Normal breath sounds bilaterally, no wheezing, rales,rhonchi  No use of accessory muscles of respiration.  CARDIOVASCULAR: irr, irr S1, S2 normal. No murmurs, rubs, or gallops.  ABDOMEN: Soft, non-tender, non-distended. Bowel sounds present. No organomegaly or mass.  EXTREMITIES: No pedal edema, cyanosis, or clubbing.  PSYCHIATRIC: The patient is alert and oriented x 3.  SKIN: No obvious rash, lesion, or ulcer.   DATA REVIEW:   CBC Recent Labs  Lab 08/11/18 0505  WBC 12.9*  HGB 11.1*  HCT 33.3*  PLT 121*    Chemistries  Recent Labs  Lab 08/10/18 0803  08/12/18 2154 08/13/18 0449  NA 125*   < >  --  132*   K 2.8*   < > 3.5 3.7  CL 86*   < >  --  100  CO2 24   < >  --  21*  GLUCOSE 119*   < >  --  113*  BUN 16   < >  --  10  CREATININE 0.56   < >  --  0.42*  CALCIUM 9.1   < >  --  8.3*  MG 1.7   < > 1.7  --   AST 28  --   --   --   ALT 15  --   --   --   ALKPHOS 297*  --   --   --   BILITOT 1.5*  --   --   --    < > = values in this interval not displayed.    Cardiac Enzymes No results for input(s): TROPONINI in the last 168 hours.  Microbiology Results  @MICRORSLT48 @  RADIOLOGY:  No results found.    Allergies as of 08/14/2018      Reactions   Macrodantin [nitrofurantoin Macrocrystal]    "welts" per pt.  Medication List    TAKE these medications   amoxicillin-clavulanate 875-125 MG tablet Commonly known as:  AUGMENTIN Take 1 tablet by mouth every 12 (twelve) hours for 3 days. What changed:  when to take this   apixaban 5 MG Tabs tablet Commonly known as:  ELIQUIS Take 5 mg by mouth 2 (two) times daily.   atenolol 50 MG tablet Commonly known as:  TENORMIN Take 50 mg by mouth daily.   feeding supplement (ENSURE ENLIVE) Liqd Take 237 mLs by mouth 3 (three) times daily between meals.   fluticasone 50 MCG/ACT nasal spray Commonly known as:  FLONASE Place 2 sprays into both nostrils daily.   hydrocortisone 2.5 % cream Apply 1 application topically daily as needed for rash.   lisinopril 40 MG tablet Commonly known as:  PRINIVIL,ZESTRIL Take 40 mg by mouth at bedtime.   pantoprazole 40 MG tablet Commonly known as:  PROTONIX Take 40 mg by mouth daily as needed for nausea or heartburn.   PARoxetine 30 MG tablet Commonly known as:  PAXIL Take 60 mg by mouth daily.            Durable Medical Equipment  (From admission, onward)         Start     Ordered   08/14/18 1003  For home use only DME Gilford Rile  [facility])  Once    Question:  Patient needs a walker to treat with the following condition  Answer:  CVA (cerebral vascular accident) ([place])    08/14/18 1005              Management plans discussed with the patient and she is in agreement. Stable for discharge   Patient should follow up with neurology  CODE STATUS:     Code Status Orders  (From admission, onward)         Start     Ordered   08/10/18 1102  Full code  Continuous     08/10/18 1102        Code Status History    This patient has a current code status but no historical code status.      TOTAL TIME TAKING CARE OF THIS PATIENT: 38 minutes.    Note: This dictation was prepared with Dragon dictation along with smaller phrase technology. Any transcriptional errors that result from this process are unintentional.  Nemiah Kissner M.D on 08/14/2018 at 10:43 AM  Between 7am to 6pm - Pager - [phone] After 6pm go to [email or web address] - password [email or web address] [facility]  Office  [phone]  CC: Primary care physician; System, Provider Not In

## 2018-08-14 NOTE — Care Management (Signed)
Provided the patient with [employer] list per CMS.gov and copy placed on the chart Patient wants to use [facility] for [facility] I notified Corene Cornea with [facility] of the choice per secure email Patient needs a RW and BSC, notified Brad with [employer]. Patient lives alone and takes care of herself She refuses to go to Rehab and wants to go home I asked her if she would be able to take care of herself and she stated that she wants to go home

## 2018-08-14 NOTE — Progress Notes (Addendum)
Brief ZOX:WRUEAVWU [name] an 68 y.o.femalewith past medical history of hypertension, anxiety anddeep vein thrombosis (DVT) of popliteal vein of right lower extremitypresenting to the ED 3/2/2020with altered mental status. Per patient's daughter who provides most of the history, patient was started on Xarelto 3 weeks ago for DVT however she was unable to tolerate it due to nausea and decreased p.o. intake. Xarelto was therefore switched to Eliquis last Thursday 08/06/2018. Per patient's daughter, she continued to have poor p.o. intake and was noted to be lethargic and altered. Patient daughter brought her to her house however yesterday morning patient's condition had worsened therefore she was taken to the ED for further evaluation.On arrival to the ED,she was afebrile with blood pressure148/47mm Hg and pulse rate 86beats/min. There were no focal neurological deficits; she was alert andbut disoriented andunable to follow commands. Initial labs revealed elevated WBC 17.1, platelets 143, sodium 125, potassium 2.8, albumin 3.4, alkaline phosphate 297, magnesium 1.7, ammonia 12, urinalysis negative for UTI.; an insidious infectious workup was initiated, including rapid HIV, RPR were ultimately negative. ECG showed sinus rhythm of 51 beats per minute and the chest X-rayshowed bibasilar atelectasis. A non-contrast head CT showedno acute intracranial abnormality. Follow-up MRI of the brain was obtained yesterday and showed numerous subcentimeter foci of reduced diffusion compatible with acute/early subacute infarction throughout the supratentorial brain and cerebellum concerning for embolic etiology. Neurology was therefore consulted for further evaluation and management.  Subjective: No significant events noted overnight. Mental status improving with some delayed responses noted. No new stroke or stroke like symptoms reported.  Objective: Current vital signs: BP (!) 147/83 (BP Location: Left  Arm)   Pulse (!) 146   Temp 98.7 F (37.1 C)   Resp 18   Ht 5\' 3"  (1.6 m)   Wt 103.9 kg   SpO2 94%   BMI 40.57 kg/m  Vital signs in last 24 hours: Temp:  [97.9 F (36.6 C)-98.7 F (37.1 C)] 98.7 F (37.1 C) (03/06 0553) Pulse Rate:  [86-146] 146 (03/06 0553) Resp:  [15-22] 18 (03/06 0553) BP: (132-151)/(69-85) 147/83 (03/06 0553) SpO2:  [94 %-100 %] 94 % (03/06 0553) Weight:  [103.9 kg] 103.9 kg (03/05 1439)  Intake/Output from previous day: 03/05 0701 - 03/06 0700 In: 1098.8 [P.O.:120; I.V.:978.8] Out: -  Intake/Output this shift: Total I/O In: 243.2 [I.V.:243.2] Out: 100 [Urine:100] Nutritional status:  Diet Order            Diet NPO time specified  Diet effective now             Neurological Exam  Mental Status: Alert, oriented, thought content appropriate. Speech fluent without evidence of aphasia. Able to follow 3 step commands without difficulty. Attention span and concentration seemed appropriate  Cranial Nerves: II: Discs flat bilaterally; Visual fields grossly normal, pupils equal, round, reactive to light and accommodation III,IV, VI: ptosis not present, extra-ocular motions intact bilaterally V,VII: smile symmetric, facial light touch sensationintact VIII: hearing normal bilaterally IX,X: gag reflex present XI: bilateral shoulder shrug XII: midline tongue extension Motor: Right :Upper extremity 5/5Without pronator driftLeft: Upper extremity 5/5 without pronator drift Right:Lower extremity 5/5Left: Lower extremity 5/5 Tone and bulk:normal tone throughout; no atrophy noted Sensory: Pinprick and light touchintact bilaterally Deep Tendon Reflexes: 2+ and symmetric throughout Plantars: Right:muteLeft: mute Cerebellar: Finger-to-nosetesting intact bilaterally.Heel to shin testing normal bilaterally Gait: not tested due to safety concerns  Data  Reviewed  Lab Results: Basic Metabolic Panel: Recent Labs  Lab 08/10/18 0803 08/10/18 1327 08/11/18 0505 08/12/18  3382 08/12/18 2154 08/13/18 0449  NA 125* 129* 130* 134*  --  132*  K 2.8* 3.5 2.9* 3.7 3.5 3.7  CL 86* 92* 96* 102  --  100  CO2 24 25 21* 23  --  21*  GLUCOSE 119* 99 91 110*  --  113*  BUN 16 14 14 12   --  10  CREATININE 0.56 0.55 0.42* 0.45  --  0.42*  CALCIUM 9.1 8.0* 8.1* 8.1*  --  8.3*  MG 1.7  --  1.9 1.8 1.7  --     Liver Function Tests: Recent Labs  Lab 08/10/18 0803  AST 28  ALT 15  ALKPHOS 297*  BILITOT 1.5*  PROT 7.4  ALBUMIN 3.4*   No results for input(s): LIPASE, AMYLASE in the last 168 hours. Recent Labs  Lab 08/11/18 1346  AMMONIA 12    CBC: Recent Labs  Lab 08/10/18 0803 08/11/18 0505  WBC 17.1* 12.9*  HGB 13.2 11.1*  HCT 38.6 33.3*  MCV 83.0 84.9  PLT 143* 121*    Cardiac Enzymes: No results for input(s): CKTOTAL, CKMB, CKMBINDEX, TROPONINI in the last 168 hours.  Lipid Panel: Recent Labs  Lab 08/12/18 0454  CHOL 140  TRIG 107  HDL 43  CHOLHDL 3.3  VLDL 21  LDLCALC 76    CBG: Recent Labs  Lab 08/10/18 0755 08/10/18 1231  GLUCAP 93 86    Microbiology: Results for orders placed or performed during the hospital encounter of 08/10/18  CULTURE, BLOOD (ROUTINE X 2) w Reflex to ID Panel     Status: None (Preliminary result)   Collection Time: 08/10/18 11:33 AM  Result Value Ref Range Status   Specimen Description BLOOD LEFT ANTECUBITAL  Final   Special Requests   Final    BOTTLES DRAWN AEROBIC AND ANAEROBIC Blood Culture results may not be optimal due to an excessive volume of blood received in culture bottles   Culture   Final    NO GROWTH 4 DAYS Performed at [facility], [address] [place], [place] [address]    Report Status PENDING  Incomplete  CULTURE, BLOOD (ROUTINE X 2) w Reflex to ID Panel     Status: None (Preliminary result)   Collection Time: 08/10/18 11:36 AM  Result Value Ref  Range Status   Specimen Description BLOOD RIGHT ANTECUBITAL  Final   Special Requests   Final    BOTTLES DRAWN AEROBIC AND ANAEROBIC Blood Culture adequate volume   Culture   Final    NO GROWTH 4 DAYS Performed at [facility], [address] [place], [place] [address]    Report Status PENDING  Incomplete    Coagulation Studies: No results for input(s): LABPROT, INR in the last 72 hours.  Imaging: No results found.  Medications:  I have reviewed the patient's current medications. Prior to Admission:  Medications Prior to Admission  Medication Sig Dispense Refill Last Dose  . [EXPIRED] amoxicillin-clavulanate (AUGMENTIN) 875-125 MG tablet Take 1 tablet by mouth 2 (two) times daily.   08/09/2018 at Unknown time  . apixaban (ELIQUIS) 5 MG TABS tablet Take 5 mg by mouth 2 (two) times daily.   08/09/2018 at 2330  . atenolol (TENORMIN) 50 MG tablet Take 50 mg by mouth daily.   08/09/2018 at 1000  . fluticasone (FLONASE) 50 MCG/ACT nasal spray Place 2 sprays into both nostrils daily.   08/09/2018 at Unknown time  . hydrocortisone 2.5 % cream Apply 1 application topically daily as needed  for rash.   Unknown at PRN  . lisinopril (PRINIVIL,ZESTRIL) 40 MG tablet Take 40 mg by mouth at bedtime.   08/09/2018 at 1130  . pantoprazole (PROTONIX) 40 MG tablet Take 40 mg by mouth daily as needed for nausea or heartburn.   Unknown at PRN  . PARoxetine (PAXIL) 30 MG tablet Take 60 mg by mouth daily.   08/09/2018 at 1000   Scheduled: . amoxicillin-clavulanate  1 tablet Oral Q12H  . apixaban  5 mg Oral BID  . atenolol  50 mg Oral Daily  . feeding supplement (ENSURE ENLIVE)  237 mL Oral TID BM  . fluticasone  2 spray Each Nare Daily  . ketamine (KETALAR) injection 10mg /mL (IV use)  1 mg/kg Intravenous Once  . lisinopril  40 mg Oral Daily  . LORazepam  1 mg Intravenous Once  . multivitamin with minerals  1 tablet Oral Daily   Assessment:67 y.o.femalewith past medical history of hypertension,  anxiety anddeep vein thrombosis (DVT) of popliteal vein of right lower extremitypresenting to the ED 3/2/2020with altered mental status. MRI of the brain reviewed and shows numerous subcentimeter foci of reduced diffusion compatible acute/early subacute infarction throughout the supratentorial brain and cerebellum. Multiple vascular territoriesinvolvement concerning for embolic etiology. Patient was recently started on Xarelto for DVT which was switched to Eliquis on 08/06/2018. Patient unsure if she was taking her medication correctly. States she may have been taking it once a day? Echocardiogram shows evidence of cardiomyopathy with EF 35-40%, no source of cardiac emboli noted. EEG did show evidence of epileptiform discharges. Follow up TEE  Shows the left ventricle has moderate-severely reduced systolic function, with an ejection fraction of 30-35%. Hypokinesis of the anterior, anteroseptal and possible akinesis of the apical region. Grossly no mural thrombus noted though thrombus can not be  definatively excluded. Concerns remains for possible mural thrombus as source of stroke.   Plan: 1. Unclear if she was compliant with eliquis. Agree with changing to coumadin  2. Statin with goal low density lipoprotein (LDL) <70 mg/dl 3. Medication compliance counseling       LOS: 4 days  08/14/2018  11:54 AM

## 2018-08-14 NOTE — Care Management (Signed)
Called the daughter Estill Bamberg) and left a VM [phone] letting her know that PT has not seen the patient yet today but once the recommendation is made I will notify her,  I also let her know that the patient is wanting to go home with Home health but another option is SNF for rehab.  I will call her back once the PT notes are available with the recommendation

## 2018-08-14 NOTE — Care Management Important Message (Signed)
Important Message  Patient Details  Name: Diana Todd MRN: [id number] Date of Birth: 1950-07-29   Medicare Important Message Given:  Yes    [name] A [name] 08/14/2018, 11:17 AM

## 2018-08-14 NOTE — Progress Notes (Signed)
Physical Therapy Treatment Patient Details Name: Diana Todd MRN: [id number] DOB: [birth year] Today's Date: 08/14/2018    History of Present Illness Pt is a 68 y.o. female presenting to hospital 08/10/18 with AMS and facial pain.  Pt admitted with severe hyponatremia with AMS, hypomagnesium, and hypokalemia.  Agitation noted during hospital stay. MRI+ for acute/early subacute infarction throughout the supratentorial brain and cerebellum. PMH includes anxiety, depression, htn, DVT, and reported 50# weight loss since November.    PT Comments    Patient alert, oriented to self, time, situation, and place, behavior WFLs this session. Mild difficulty with multi-step commands and short term memory noted. Session also interrupted ~71minutes by physician, pt able to sit EOB with single UE support for entirety of conversation. Pt needed minAx1 for multiple sit <> stand transfers this session, as well as constant cues for AD use and hand placement to maximize safety. Ambulated a total of ~61ft with RW, minAx1 and close chair follow. HR monitored throughout session; 110s with sitting and talking, with ambulation (~76ft prior to seated rest break) HR up to 140s, recovered to 120s quickly. Second ambulation trial HR 120s-130s, patient complained of fatigue and seated rest break given. Seated HR 145, returned to 120s within a few minutes of rest. The patient would benefit from further skilled PT to maximize safety, mobility, and function. Recommendation is CIR, pt agreeable to more information about rehabilitation.    Follow Up Recommendations  CIR     Equipment Recommendations  Rolling walker with 5" wheels    Recommendations for Other Services OT consult     Precautions / Restrictions Precautions Precautions: Fall Precaution Comments: monitor HR Restrictions Weight Bearing Restrictions: No    Mobility  Bed Mobility Overal bed mobility: Needs Assistance             General bed mobility  comments: Pt in long sitting at start of session, able to mobilize to EOB with supervision  Transfers Overall transfer level: Needs assistance Equipment used: Rolling walker (2 wheeled) Transfers: Sit to/from Stand Sit to Stand: Min assist         General transfer comment: assist to initiate stand and control descent sitting; vc's for UE/LE placement  Ambulation/Gait Ambulation/Gait assistance: Min assist;+2 physical assistance;+2 safety/equipment Gait Distance (Feet): 65 Feet Assistive device: Rolling walker (2 wheeled) Gait Pattern/deviations: Step-through pattern     General Gait Details: Cues and assistance needed for AD management, upright posture. flexed trunk, decreased step length bilaterally, pt mildly SOB. HR elevated to 140s, returned to 110s, with ambulation increased to high 130s, further mobility held.    Stairs             Wheelchair Mobility    Modified Rankin (Stroke Patients Only)       Balance Overall balance assessment: Needs assistance Sitting-balance support: Feet supported Sitting balance-Leahy Scale: Fair     Standing balance support: Bilateral upper extremity supported Standing balance-Leahy Scale: Poor                              Cognition Arousal/Alertness: Awake/alert Behavior During Therapy: WFL for tasks assessed/performed Overall Cognitive Status: Impaired/Different from baseline                       Memory: Decreased short-term memory Following Commands: Follows multi-step commands inconsistently     Problem Solving: Slow processing General Comments: oriented to self, place, situation, time  Exercises      General Comments        Pertinent Vitals/Pain      Home Living                      Prior Function            PT Goals (current goals can now be found in the care plan section) Progress towards PT goals: Progressing toward goals    Frequency    7X/week       PT Plan Discharge plan needs to be updated    Co-evaluation              AM-PAC PT "6 Clicks" Mobility   Outcome Measure  Help needed turning from your back to your side while in a flat bed without using bedrails?: A Little Help needed moving from lying on your back to sitting on the side of a flat bed without using bedrails?: A Little Help needed moving to and from a bed to a chair (including a wheelchair)?: A Little Help needed standing up from a chair using your arms (e.g., wheelchair or bedside chair)?: A Little Help needed to walk in hospital room?: A Lot Help needed climbing 3-5 steps with a railing? : Total 6 Click Score: 15    End of Session Equipment Utilized During Treatment: Gait belt Activity Tolerance: Treatment limited secondary to medical complications (Comment);Patient limited by fatigue(HR) Patient left: with call bell/phone within reach;with chair alarm set;in chair;with family/visitor present Nurse Communication: Mobility status PT Visit Diagnosis: Other abnormalities of gait and mobility (R26.89);Unsteadiness on feet (R26.81);Muscle weakness (generalized) (M62.81)     Time: 1133-1209 PT Time Calculation (min) (ACUTE ONLY): 36 min  Charges:  $Therapeutic Activity: 23-37 mins                    Lieutenant Diego PT, DPT 1:19 PM,08/14/18 [phone]

## 2018-08-14 NOTE — Consult Note (Signed)
[facility] Face-to-Face Psychiatry Consult   Reason for Consult: Consult for this 68 year old woman who presented to the hospital with altered mental status found to be related to hyponatremia and stroke.  Concern about depression Referring Physician: Mody Patient Identification: Diana Todd MRN:  [id number] Principal Diagnosis: Moderate recurrent major depression ([place]) Diagnosis:  Principal Problem:   Moderate recurrent major depression ([place]) Active Problems:   Hyponatremia   Stroke [facility])   Total Time spent with patient: 1 hour  Subjective:   Diana Todd is a 68 y.o. female patient admitted with "my daughter says I flipped out".  HPI: Patient seen chart reviewed.  Patient presented to the hospital with altered mental status.  Found to be hyponatremic.  Later found to have multiple foci of likely recent stroke.  Patient does not remember presenting to the hospital.  She says that she remembers that she has been feeling bad for quite a while.  She had to have many teeth extracted a few months ago and ever since then has been feeling down and negative.  Has lost interest in most of her activities that she used to enjoy such as taking care of her animals.  Sleep has been poor and interrupted.  Appetite has been poor and she has lost significant weight.  She says she has felt discouraged and depressed.  She denies ever having any thoughts of killing herself or any suicidal ideation.  Denies psychotic symptoms.  Patient has been compliant with prescribed outpatient antidepressant of Prozac 60 mg a day.  Family tells me they have been aware that she has been depressed for months.  Medical history: Recent new strokes with changes in her ability to walk and other neurologic symptoms.  Recently had a deep vein thrombosis in her leg and had been on anticoagulant medicine.  Recent hyponatremia which has resolved with hydration.  Social history: Patient had been living independently although it sounds  like this is been getting harder for her recently.  She plans to move in with her daughter when she leaves.  I spoke with the patient's sister who is also involved with and concerned about her.  Substance abuse history: Denies alcohol or drug abuse current or past  Past Psychiatric History: No history of hospitalization.  No history of suicide attempts.  Has been on Paxil for at least a few years.  She cannot remember ever being on any other antidepressant medicine.  No other psychiatric history  Risk to Self:   Risk to Others:   Prior Inpatient Therapy:   Prior Outpatient Therapy:    Past Medical History:  Past Medical History:  Diagnosis Date  . Anxiety   . DVT (deep venous thrombosis) ([place])   . Hypertension     Past Surgical History:  Procedure Laterality Date  . NO PAST SURGERIES     Family History:  Family History  Problem Relation Age of Onset  . Breast cancer Mother   . AAA (abdominal aortic aneurysm) Father    Family Psychiatric  History: None reported Social History:  Social History   Substance and Sexual Activity  Alcohol Use Never  . Frequency: Never     Social History   Substance and Sexual Activity  Drug Use Never    Social History   Socioeconomic History  . Marital status: Unknown    Spouse name: Not on file  . Number of children: Not on file  . Years of education: Not on file  . Highest education level: Not  on file  Occupational History  . Not on file  Social Needs  . Financial resource strain: Not on file  . Food insecurity:    Worry: Not on file    Inability: Not on file  . Transportation needs:    Medical: Not on file    Non-medical: Not on file  Tobacco Use  . Smoking status: Never Smoker  . Smokeless tobacco: Never Used  Substance and Sexual Activity  . Alcohol use: Never    Frequency: Never  . Drug use: Never  . Sexual activity: Not on file  Lifestyle  . Physical activity:    Days per week: Not on file    Minutes per session:  Not on file  . Stress: Not on file  Relationships  . Social connections:    Talks on phone: Not on file    Gets together: Not on file    Attends religious service: Not on file    Active member of club or organization: Not on file    Attends meetings of clubs or organizations: Not on file    Relationship status: Not on file  Other Topics Concern  . Not on file  Social History Narrative  . Not on file   Additional Social History:    Allergies:   Allergies  Allergen Reactions  . Macrodantin [Nitrofurantoin Macrocrystal]     "welts" per pt.     Labs:  Results for orders placed or performed during the hospital encounter of 08/10/18 (from the past 48 hour(s))  Potassium     Status: None   Collection Time: 08/12/18  9:54 PM  Result Value Ref Range   Potassium 3.5 3.5 - 5.1 mmol/L    Comment: Performed at [facility], [address] [place], [place] [address]  Magnesium     Status: None   Collection Time: 08/12/18  9:54 PM  Result Value Ref Range   Magnesium 1.7 1.7 - 2.4 mg/dL    Comment: Performed at [facility], [address] [place], [place] [address]  Basic metabolic panel     Status: Abnormal   Collection Time: 08/13/18  4:49 AM  Result Value Ref Range   Sodium 132 (L) 135 - 145 mmol/L   Potassium 3.7 3.5 - 5.1 mmol/L   Chloride 100 98 - 111 mmol/L   CO2 21 (L) 22 - 32 mmol/L   Glucose, Bld 113 (H) 70 - 99 mg/dL   BUN 10 8 - 23 mg/dL   Creatinine, Ser 0.42 (L) 0.44 - 1.00 mg/dL   Calcium 8.3 (L) 8.9 - 10.3 mg/dL   GFR calc non Af Amer >60 >60 mL/min   GFR calc Af Amer >60 >60 mL/min   Anion gap 11 5 - 15    Comment: Performed at [facility], [address], [place], [place] [address]    Current Facility-Administered Medications  Medication Dose Route Frequency Provider Last Rate Last Dose  . 0.9 % NaCl with KCl 20 mEq/ L  infusion   Intravenous Continuous Loletha Grayer, MD 75 mL/hr at 08/14/18 0825    . acetaminophen  (TYLENOL) tablet 650 mg  650 mg Oral Q6H PRN Loletha Grayer, MD   650 mg at 08/10/18 2254   Or  . acetaminophen (TYLENOL) suppository 650 mg  650 mg Rectal Q6H PRN Loletha Grayer, MD      . amoxicillin-clavulanate (AUGMENTIN) 875-125 MG per tablet 1 tablet  1 tablet Oral Q12H Salary, Avel Peace, MD   1  tablet at 08/14/18 0819  . apixaban (ELIQUIS) tablet 5 mg  5 mg Oral BID Loletha Grayer, MD   5 mg at 08/14/18 0819  . atenolol (TENORMIN) tablet 50 mg  50 mg Oral Daily Bettey Costa, MD   50 mg at 08/14/18 1028  . feeding supplement (ENSURE ENLIVE) (ENSURE ENLIVE) liquid 237 mL  237 mL Oral TID BM Salary, Montell D, MD   237 mL at 08/14/18 1027  . fluticasone (FLONASE) 50 MCG/ACT nasal spray 2 spray  2 spray Each Nare Daily Wieting, Richard, MD   2 spray at 08/14/18 1191  . haloperidol lactate (HALDOL) injection 1 mg  1 mg Intravenous Q6H PRN Loletha Grayer, MD   1 mg at 08/12/18 4782  . hydrALAZINE (APRESOLINE) injection 10 mg  10 mg Intravenous Q6H PRN Bettey Costa, MD      . ketamine (KETALAR) injection 104 mg  1 mg/kg Intravenous Once Wieting, Richard, MD      . lisinopril (PRINIVIL,ZESTRIL) tablet 40 mg  40 mg Oral Daily Loletha Grayer, MD   40 mg at 08/14/18 0819  . LORazepam (ATIVAN) injection 1 mg  1 mg Intravenous Once Lance Coon, MD      . multivitamin with minerals tablet 1 tablet  1 tablet Oral Daily Salary, Avel Peace, MD   1 tablet at 08/14/18 0819  . ondansetron (ZOFRAN) tablet 4 mg  4 mg Oral Q6H PRN Wieting, Richard, MD       Or  . ondansetron (ZOFRAN) injection 4 mg  4 mg Intravenous Q6H PRN Wieting, Richard, MD      . pantoprazole (PROTONIX) EC tablet 40 mg  40 mg Oral Daily PRN Loletha Grayer, MD        Musculoskeletal: Strength & Muscle Tone: decreased Gait & Station: unsteady Patient leans: N/A  Psychiatric Specialty Exam: Physical Exam  Nursing note and vitals reviewed. Constitutional: She appears well-developed and well-nourished. She appears listless.   HENT:  Head: Normocephalic and atraumatic.  Eyes: Pupils are equal, round, and reactive to light. Conjunctivae are normal.  Neck: Normal range of motion.  Cardiovascular: Regular rhythm and normal heart sounds.  Respiratory: Effort normal.  GI: Soft.  Musculoskeletal: Normal range of motion.  Neurological: She appears listless. She exhibits abnormal muscle tone.  Skin: Skin is warm and dry.  Psychiatric: Judgment normal. Her speech is delayed. She is slowed. Thought content is not paranoid. Cognition and memory are normal. She exhibits a depressed mood. She expresses no homicidal and no suicidal ideation.    Review of Systems  Constitutional: Positive for malaise/fatigue.  HENT: Negative.   Eyes: Negative.   Respiratory: Negative.   Cardiovascular: Negative.   Gastrointestinal: Negative.   Musculoskeletal: Negative.   Skin: Negative.   Neurological: Positive for focal weakness.  Psychiatric/Behavioral: Positive for depression and memory loss. Negative for hallucinations, substance abuse and suicidal ideas. The patient has insomnia. The patient is not nervous/anxious.     Blood pressure (!) 147/83, pulse (!) 146, temperature 98.7 F (37.1 C), resp. rate 18, height 5\' 3"  (1.6 m), weight 103.9 kg, SpO2 94 %.Body mass index is 40.57 kg/m.  General Appearance: Disheveled  Eye Contact:  Fair  Speech:  Slow  Volume:  Decreased  Mood:  Depressed  Affect:  Congruent  Thought Process:  Coherent  Orientation:  Full (Time, Place, and Person)  Thought Content:  Logical  Suicidal Thoughts:  No  Homicidal Thoughts:  No  Memory:  Immediate;   Fair Recent;   Poor Remote;  Fair  Judgement:  Fair  Insight:  Fair  Psychomotor Activity:  Decreased  Concentration:  Concentration: Fair  Recall:  Poor  Fund of Knowledge:  Fair  Language:  Fair  Akathisia:  No  Handed:  Right  AIMS (if indicated):     Assets:  Desire for Improvement Financial Resources/Insurance Housing Social Support   ADL's:  Impaired  Cognition:  Impaired,  Mild  Sleep:        Treatment Plan Summary: Plan This is a 68 year old woman with a past history of depression who is presenting now with multiple symptoms of depression that have been present for several months.  Family reports they have been aware of it and have been telling the patient they think her depression is worse for months even before this current episode of sickness.  Patient has been treated with Paxil which had previously been helpful.  Patient is not presenting as suicidal or psychotic or dangerous.  She is open to changes in treatment and has insight into her depression.  Patient does not require psychiatric hospitalization.  I spoke with the patient about the common need to change antidepressants over time even when 1 of them has been working.  Also told her that her stroke and recent life changes put her at higher risk for depression.  We discussed 2 possible options either that I could start a new antidepressant plan for her today as she is being discharged or that she follow-up with her primary care doctor.  Patient prefers to follow-up with her primary care doctor which I think is actually appropriate as long as she make sure that she follows up.  With her consent I spoke to her sister and told her the same thing.  I hope that family make sure that this gets addressed.  I told the patient that if I were going to suggest a change I would probably propose starting mirtazapine while tapering the Paxil.  No change however needs to be made as to the plan for discharge today.  Disposition: No evidence of imminent risk to self or others at present.   Patient does not meet criteria for psychiatric inpatient admission. Supportive therapy provided about ongoing stressors. Discussed crisis plan, support from social network, calling 911, coming to the Emergency Department, and calling Suicide Hotline.  Alethia Berthold, MD 08/14/2018 12:10 PM

## 2018-08-14 NOTE — Care Management (Signed)
Daughter Estill Bamberg called and stated that the patient does not eat at home and that is the reason she needs to go to SNF.  I explained that the patient wants to go home and that as long as she is lucid and Alert and oriented we have to go by what the patient wants.  I let Estill Bamberg know that I would ask the Doctor about ordering Speech therapy for [facility] to help with the eating training.  The daughter also requested a Psych eval due to to patient being depressed.  I relayed the information to Dr. Benjie Karvonen

## 2018-08-15 LAB — CULTURE, BLOOD (ROUTINE X 2)
CULTURE: NO GROWTH
Culture: NO GROWTH
Special Requests: ADEQUATE

## 2018-08-17 ENCOUNTER — Other Ambulatory Visit: Payer: Self-pay

## 2018-08-17 ENCOUNTER — Emergency Department (HOSPITAL_COMMUNITY)
Admission: EM | Admit: 2018-08-17 | Discharge: 2018-08-19 | Disposition: A | Discharge status: Other Acute Inpatient Hospital | Payer: PPO | Attending: Emergency Medicine | Admitting: Emergency Medicine

## 2018-08-17 ENCOUNTER — Encounter (HOSPITAL_COMMUNITY): Payer: Self-pay | Admitting: *Deleted

## 2018-08-17 DIAGNOSIS — R4182 Altered mental status, unspecified: Secondary | ICD-10-CM | POA: Insufficient documentation

## 2018-08-17 DIAGNOSIS — R1011 Right upper quadrant pain: Secondary | ICD-10-CM | POA: Diagnosis not present

## 2018-08-17 DIAGNOSIS — I4891 Unspecified atrial fibrillation: Secondary | ICD-10-CM | POA: Diagnosis not present

## 2018-08-17 DIAGNOSIS — Z86718 Personal history of other venous thrombosis and embolism: Secondary | ICD-10-CM | POA: Insufficient documentation

## 2018-08-17 DIAGNOSIS — Z79899 Other long term (current) drug therapy: Secondary | ICD-10-CM | POA: Diagnosis not present

## 2018-08-17 DIAGNOSIS — F330 Major depressive disorder, recurrent, mild: Secondary | ICD-10-CM | POA: Insufficient documentation

## 2018-08-17 DIAGNOSIS — R630 Anorexia: Secondary | ICD-10-CM | POA: Diagnosis not present

## 2018-08-17 LAB — URINALYSIS, ROUTINE W REFLEX MICROSCOPIC
Bilirubin Urine: NEGATIVE
Glucose, UA: NEGATIVE mg/dL
Ketones, ur: 5 mg/dL — AB
Leukocytes,Ua: NEGATIVE
Nitrite: NEGATIVE
Protein, ur: 30 mg/dL — AB
Specific Gravity, Urine: 1.026 (ref 1.005–1.030)
pH: 5 (ref 5.0–8.0)

## 2018-08-17 LAB — COMPREHENSIVE METABOLIC PANEL
ALT: 19 U/L (ref 0–44)
AST: 45 U/L — ABNORMAL HIGH (ref 15–41)
Albumin: 2.8 g/dL — ABNORMAL LOW (ref 3.5–5.0)
Alkaline Phosphatase: 237 U/L — ABNORMAL HIGH (ref 38–126)
Anion gap: 10 (ref 5–15)
BUN: 19 mg/dL (ref 8–23)
CO2: 23 mmol/L (ref 22–32)
Calcium: 8.6 mg/dL — ABNORMAL LOW (ref 8.9–10.3)
Chloride: 99 mmol/L (ref 98–111)
Creatinine, Ser: 0.74 mg/dL (ref 0.44–1.00)
GFR calc Af Amer: >60 mL/min (ref >60)
GFR calc non Af Amer: >60 mL/min (ref >60)
Glucose, Bld: 103 mg/dL — ABNORMAL HIGH (ref 70–99)
Potassium: 4.4 mmol/L (ref 3.5–5.1)
Sodium: 132 mmol/L — ABNORMAL LOW (ref 135–145)
Total Bilirubin: 1.3 mg/dL — ABNORMAL HIGH (ref 0.3–1.2)
Total Protein: 6.2 g/dL — ABNORMAL LOW (ref 6.5–8.1)

## 2018-08-17 LAB — CBC
HCT: 37 % (ref 36.0–46.0)
Hemoglobin: 11.7 g/dL — ABNORMAL LOW (ref 12.0–15.0)
MCH: 27.9 pg (ref 26.0–34.0)
MCHC: 31.6 g/dL (ref 30.0–36.0)
MCV: 88.1 fL (ref 80.0–100.0)
Platelets: 96 10*3/uL — ABNORMAL LOW (ref 150–400)
RBC: 4.2 MIL/uL (ref 3.87–5.11)
RDW: 14.9 % (ref 11.5–15.5)
WBC: 14.9 10*3/uL — ABNORMAL HIGH (ref 4.0–10.5)
nRBC: 0 % (ref 0.0–0.2)

## 2018-08-17 LAB — LIPASE, BLOOD: Lipase: 38 U/L (ref 11–51)

## 2018-08-17 MED ORDER — SODIUM CHLORIDE 0.9 % IV BOLUS
1000.0000 mL | Freq: Once | INTRAVENOUS | Status: AC
  Administered 2018-08-17: 1000 mL via INTRAVENOUS

## 2018-08-17 MED ORDER — ACETAMINOPHEN 500 MG PO TABS: 500.0000 mg | ORAL_TABLET | Freq: Four times a day (QID) | ORAL | Status: DC | PRN

## 2018-08-17 MED ORDER — LORAZEPAM 1 MG PO TABS
0.5000 mg | ORAL_TABLET | Freq: Once | ORAL | Status: AC
  Administered 2018-08-17: 0.5 mg via ORAL
  Filled 2018-08-17: qty 1

## 2018-08-17 MED ORDER — ATENOLOL 50 MG PO TABS
50.0000 mg | ORAL_TABLET | Freq: Every day | ORAL | Status: DC
  Administered 2018-08-18 – 2018-08-19 (×2): 50 mg via ORAL
  Filled 2018-08-17 (×2): qty 1

## 2018-08-17 MED ORDER — SODIUM CHLORIDE 0.9% FLUSH
3.0000 mL | Freq: Once | INTRAVENOUS | Status: AC
  Administered 2018-08-17: 3 mL via INTRAVENOUS

## 2018-08-17 MED ORDER — AMOXICILLIN-POT CLAVULANATE 875-125 MG PO TABS
1.0000 | ORAL_TABLET | Freq: Two times a day (BID) | ORAL | Status: DC
  Administered 2018-08-17 – 2018-08-19 (×4): 1 via ORAL
  Filled 2018-08-17 (×4): qty 1

## 2018-08-17 MED ORDER — FLUTICASONE PROPIONATE 50 MCG/ACT NA SUSP
2.0000 | Freq: Every day | NASAL | Status: DC
  Administered 2018-08-18: 2 via NASAL
  Filled 2018-08-17: qty 16

## 2018-08-17 MED ORDER — METOPROLOL TARTRATE 5 MG/5ML IV SOLN
5.0000 mg | Freq: Once | INTRAVENOUS | Status: AC
  Administered 2018-08-17: 5 mg via INTRAVENOUS
  Filled 2018-08-17: qty 5

## 2018-08-17 MED ORDER — LISINOPRIL 20 MG PO TABS
40.0000 mg | ORAL_TABLET | Freq: Every day | ORAL | Status: DC
  Administered 2018-08-17 – 2018-08-18 (×2): 40 mg via ORAL
  Filled 2018-08-17 (×2): qty 2

## 2018-08-17 MED ORDER — APIXABAN 5 MG PO TABS
5.0000 mg | ORAL_TABLET | Freq: Two times a day (BID) | ORAL | Status: DC
  Administered 2018-08-17 – 2018-08-18 (×2): 5 mg via ORAL
  Filled 2018-08-17 (×5): qty 1

## 2018-08-17 NOTE — ED Notes (Signed)
ED stretcher replaced with hospital bed for comfort. PT resting in recliner at this time.

## 2018-08-17 NOTE — ED Notes (Signed)
PT at bedside.

## 2018-08-17 NOTE — Progress Notes (Addendum)
2nd shift ED CSW received a handoff from the 1st shift [facility] ED CSW.   CSW was informed by 1st shift CSW that 1st shift CSW received consult for placment. CSW spoke to EDP and EDP feels pt is not at pt's baseline; that patients condition is potentially rehab-able and would like to pursue placement from ED for the pt's safety if family is agreeable.   CSW spoke with Pt's daughter who sounded angry and used profanity when discussing the pt's/pt's daughter's experience at [facility] stating that pt/pt's family was led to believe they "could return to the hospital and begin CIR from (the "community if needed) and informed daughter that CSW was informed of pts concerns. Pt's daughter expressed concerns regarding the safety if not discharged to SNF from ED. Pt's daughter explained that pt lives alone and family is unable to take pt in due to crowded conditions, ie multiple dogs living indoors as well. Pt's daughter also explains that although family has been able to assist pt over the weekend and on Monday, they are unable to provide 24 hour care due to work obligations.   CSW discussed benefits and disadvantages to being placed from the ED, as well as possible barriers such Pt's insurance not authorizing SNF rehab days due to possibility insurance will consider pt's condition as pt's new baseline. CSW also explains that due to time constraints in the ED, if pt is accepted to a non preferred SNF, pt will have to discharge to said SNF or D/C home with [facility] services to be placed by home health social workers.   CSW cautioned family against false hopes in case pt is not successfully placed in a SNF due to insurance barriers or other factors. CSW encouraged family to brainstorm safety plan in case SNF is not an option. CSW inquired about SNF preferences from family. Pt's daughter stated that [facility] in [place] was a preference because Pt's sister worked there as well.  Pt currently lives in [place] and prefers pt to  be placed as close to [place] as possible.    CSW requested permission from family to explore SNF options in [place], [place], [place], [place] and other surrounding areas as well as [place]. CSW educated family about the placement process that occurs from an ED standpoint and again reminded family that due to time constraints pt has to choose from first SNF's to offer a bed on 3/10. CSW also counseled family that if pt goes to a non-preferred SNF, Pt can always transfer to a preferred SNF at a later date. CSW also cautioned family again that if placement is not found in a timely manner, then pt will have to D/C home to be potentially placed by pt HH.   CSW received permission from family to move forward with the placement process and to send FL-2 and referral to area SNF's.   CSW provided family with the number of the social worker at [facility] ph: [phone] on 3/10. Pt's daughter was appreciative and thanked CSW and apologized for presenting as angry and frustrated intially and in the call.  CSW will call EPD to update RN  CSW will continue to follow for D/C needs.  8:52 PM CSW completed FL-2 and sent out referrals out via the Hub per Pt/pt's daughter request.   CSW spoke with pt/pt's daughter and confirmed pt's daughter's plan for pt to be discharged to SNF for rehab at discharge.  CSW provided active listening and validated pt's daughter's concerns. CSW will completed FL-2  and sent referrals out to SNF facilities via the hub per pt's request.  Pt has been living independently prior to being admitted to [facility].  Alphonse Guild. Kataleyah Carducci, LCSW, LCAS, CSI Clinical Social Worker Ph: [phone]

## 2018-08-17 NOTE — Progress Notes (Signed)
CSW consulted for possible placement. CSW spoke with pt and daughter at bedside. CSW was advised that pt was discharged from [facility] on Friday and was set up with [facility]. CSW was advised that per daughter and pt this was an idea for pt and suggested that pt should have gone to CIR as initially planned. CSW was advised by daughter that pt lives alone and that pt has no one to help care for her at this time. Per pt she is interested in CIR therefore she expressed that her MD told her t come to the ED to get that. CSW advised both pt and daughter that inpt rehab is not an option from the ED as they only take pt's from inpatient units-not from home or the ED. Daughter expressed that she has been speaking with Diana Todd from [facility] and was informed that they would take pt and all pt needed was a MD's note. CSW advised daughter that CSW would follow up with Diana Todd about this. CSW spoke with Diana Todd from [facility] and was informed that she has been speaking with pt's daughter however she informed pt and daughter that the WILL NOT TAKE pt in the inpt rehab as they do not admit from homme of from the ED. CSW was advised by Diana Todd that she explained to daughter that pt would need to be placed into a SNF facility if pt is unable to get the care at home that is needed.   CSW has spoken with PT and they will assess pt for further needs of SNF placement. CSW spoke with Diana Todd from [facility] and was informed that they cant do anything until new PT note has been placed. At this time CSW awaits PT recommendation for further placement needs.    Virgie Dad Diana Todd, MSW, [place] [facility] Clinical Social Worker [phone]

## 2018-08-17 NOTE — ED Triage Notes (Signed)
Pt in c/o continued abdominal pain and bilateral lower extremity swelling, recently admitted at [facility] for same and discharged on Friday, pt has been unable to eat since being home

## 2018-08-17 NOTE — Clinical Social Work Note (Signed)
Clinical Social Work Assessment  Patient Details  Name: Diana Todd MRN: [id number] Date of Birth: [birth year]  Date of referral:  08/17/18               Reason for consult:  Facility Placement                Permission sought to share information with:  Chartered certified accountant granted to share information::  Yes, Verbal Permission Granted  Name::        Agency::     Relationship::     Contact Information:     Housing/Transportation Living arrangements for the past 2 months:  Single Family Home Source of Information:  Adult Children Patient Interpreter Needed:  None Criminal Activity/Legal Involvement Pertinent to Current Situation/Hospitalization:    Significant Relationships:  Adult Children Lives with:  Self Do you feel safe going back to the place where you live?  No Need for family participation in patient care:  Yes (Comment)  Care giving concerns:  2nd shift ED CSW received a handoff from the 1st shift [facility] ED CSW.   CSW was informed by 1st shift CSW that 1st shift CSW received consult for placment. CSW spoke to EDP and EDP feels Diana Todd is not at Diana baseline; that patients condition is potentially rehab-able and would like to pursue placement from ED for the Diana safety if family is agreeable.   CSW spoke with Diana Todd who sounded angry and used profanity when discussing the Diana/Diana Todd's experience at [facility] stating that Diana Todd/Diana family was led to believe they "could return to the hospital and begin CIR from (the "community if needed) and informed Diana Todd that CSW was informed of pts concerns. Diana Todd expressed concerns regarding the safety if not discharged to SNF from ED. Diana Todd explained that Diana Todd lives alone and family is unable to take Diana Todd in due to crowded conditions, ie multiple dogs living indoors as well. Diana Todd also explains that although family has been able to assist Diana Todd over the weekend and on Monday, they are unable to  provide 24 hour care due to work obligations.   CSW discussed benefits and disadvantages to being placed from the ED, as well as possible barriers such Diana insurance not authorizing SNF rehab days due to possibility insurance will consider Diana condition as Diana new baseline. CSW also explains that due to time constraints in the ED, if Diana Todd is accepted to a non preferred SNF, Diana Todd will have to discharge to said SNF or D/C home with [facility] services to be placed by home health social workers.   CSW cautioned family against false hopes in case Diana Todd is not successfully placed in a SNF due to insurance barriers or other factors. CSW encouraged family to brainstorm safety plan in case SNF is not an option. CSW inquired about SNF preferences from family. Diana Todd stated that [facility] in [place] was a preference because Diana sister worked there as well.  Diana Todd currently lives in [place] and prefers Diana Todd to be placed as close to [place] as possible.    CSW requested permission from family to explore SNF options in [place], [place], [place], [place] and other surrounding areas as well as [place]. CSW educated family about the placement process that occurs from an ED standpoint and again reminded family that due to time constraints Diana Todd has to choose from first SNF's to offer a bed on 3/10. CSW also counseled family that if Diana Todd goes to a non-preferred  SNF, Diana Todd can always transfer to a preferred SNF at a later date. CSW also cautioned family again that if placement is not found in a timely manner, then Diana Todd will have to D/C home to be potentially placed by Diana Todd HH.   CSW received permission from family to move forward with the placement process and to send FL-2 and referral to area SNF's.   CSW provided family with the number of the social worker at [facility] ph: [phone] on 3/10. Diana Todd was appreciative and thanked CSW and apologized for presenting as angry and frustrated intially and in the call.  CSW  will call EPD to update RN  CSW will continue to follow for D/C needs.  8:52 PM CSW completed FL-2 and sent out referrals out via the Hub per Diana Todd/Diana Todd request.   Social Worker assessment / plan:  CSW spoke with Diana Todd/Diana Todd and confirmed Diana Todd's plan for Diana Todd to be discharged to SNF for rehab at discharge.  CSW provided active listening and validated Diana Todd's concerns. CSW will completed FL-2 and sent referrals out to SNF facilities via the hub per Diana request.  Diana Todd has been living independently prior to being admitted to [facility].   Employment status:  [employer] information:  Managed Medicare Diana Todd Recommendations:  [place] / Referral to community resources:     Patient/Family's Response to care:  Patient alert and not fully oriented due to stroke.  Patient and Diana daugther agreeable to plan.  Diana Todd is supportive and strongly involved in Diana Todd.'s care.  Diana Todd.'s Diana Todd pleasant and appreciated CSW intervention.     Patient/Family's Understanding of and Emotional Response to Diagnosis, Current Treatment, and Prognosis:  Diana Todd and family understand current prognosis and treatment.  Emotional Assessment Appearance:    Attitude/Demeanor/Rapport:    Affect (typically observed):  Unable to Assess Orientation:  Fluctuating Orientation (Suspected and/or reported Sundowners) Alcohol / Substance use:    Psych involvement (Current and /or in the community):     Discharge Needs  Concerns to be addressed:  No discharge needs identified Readmission within the last 30 days:  Yes Current discharge risk:  None Barriers to Discharge:  No Barriers Identified   Claudine Mouton, LCSWA 08/17/2018, 9:02 PM

## 2018-08-17 NOTE — ED Provider Notes (Addendum)
[facility] EMERGENCY DEPARTMENT Provider Note   CSN: [id number] Arrival date & time: 08/17/18  3220    History   Chief Complaint Chief Complaint  Patient presents with  . Abdominal Pain    HPI Diana Todd is a 68 y.o. female  With history of DVT, hypertension, CVA, depression presenting to emergency department today with chief complaint of abdominal pain and lower extremity edema x 3 days.  Patient's daughter reports she was started on Xarelto approximately 1 month ago for the DVT in the right lower extremity.  Patient was noncompliant and therefore switched to Eliquis.  Patient continued to be noncompliant and seemed more confused per daughter  And was recently admitted to [facility] for altered mental status.  CT scan at that time showed she had had multiple recent strokes. Also was given a new diagnosis of A. fib from cardiology while there.  Family wanted patient to go to rehab prior to coming home.  However she was discharged home.  Daughter reports patient continues to be confused, has not eaten in the last 4 days and is complaining of abdominal pain.  Abdominal pain is right upper quadrant.  Patient describes it as a constant ache.  She states since her recent admission she has had no taste and therefore is unable to eat anything.  She denies any nausea associated with this.  Patient has had one episode of emesis.  Denies fever, chills, chest pain, shortness of breath, headache.  Patient denies abdominal surgical history.      Past Medical History:  Diagnosis Date  . Anxiety   . DVT (deep venous thrombosis) ([place])   . Hypertension     Patient Active Problem List   Diagnosis Date Noted  . Moderate recurrent major depression ([place]) 08/14/2018  . Stroke ([place]) 08/14/2018  . Hyponatremia 08/10/2018    Past Surgical History:  Procedure Laterality Date  . NO PAST SURGERIES       OB History   No obstetric history on file.      Home  Medications    Prior to Admission medications   Medication Sig Start Date End Date Taking? Authorizing Provider  acetaminophen (TYLENOL) 500 MG tablet Take 500 mg by mouth every 6 (six) hours as needed for mild pain.   Yes [provider]  amoxicillin-clavulanate (AUGMENTIN) 875-125 MG tablet Take 1 tablet by mouth every 12 (twelve) hours for 3 days. 08/14/18 08/17/18 Yes Mody, Ulice Bold, MD  apixaban (ELIQUIS) 5 MG TABS tablet Take 5 mg by mouth 2 (two) times daily. 07/28/18 08/27/18 Yes [provider]  atenolol (TENORMIN) 50 MG tablet Take 50 mg by mouth daily. 07/15/18  Yes [provider]  fluticasone (FLONASE) 50 MCG/ACT nasal spray Place 2 sprays into both nostrils daily.   Yes [provider]  lisinopril (PRINIVIL,ZESTRIL) 40 MG tablet Take 40 mg by mouth at bedtime. 07/15/18  Yes [provider]  PARoxetine (PAXIL) 30 MG tablet Take 60 mg by mouth daily.   Yes [provider]  Soft Lens Products (SALINE) 0.9 % SOLN Place 1 drop into both eyes daily.   Yes [provider]  feeding supplement, ENSURE ENLIVE, (ENSURE ENLIVE) LIQD Take 237 mLs by mouth 3 (three) times daily between meals. Patient not taking: Reported on 08/17/2018 08/14/18   Bettey Costa, MD    Family History Family History  Problem Relation Age of Onset  . Breast cancer Mother   . AAA (abdominal aortic aneurysm) Father  Social History Social History   Tobacco Use  . Smoking status: Never Smoker  . Smokeless tobacco: Never Used  Substance Use Topics  . Alcohol use: Never    Frequency: Never  . Drug use: Never     Allergies   Macrodantin [nitrofurantoin macrocrystal]   Review of Systems Review of Systems  Constitutional: Positive for activity change, appetite change and fatigue.  HENT: Positive for congestion and sinus pain.   Eyes: Negative for pain and visual disturbance.  Respiratory: Negative for chest tightness and shortness of breath.     Cardiovascular: Positive for leg swelling. Negative for chest pain and palpitations.  Gastrointestinal: Positive for abdominal pain, diarrhea and vomiting. Negative for constipation and nausea.  Genitourinary: Negative for dysuria, flank pain and hematuria.  Musculoskeletal: Positive for neck pain. Negative for arthralgias and back pain.  Skin: Negative for wound.  Neurological: Negative for dizziness, numbness and headaches.  Psychiatric/Behavioral: Positive for confusion.     Physical Exam Updated Vital Signs BP 132/77 (BP Location: Right Arm)   Pulse (!) 129   Temp 97.6 F (36.4 C) (Oral)   Resp 18   SpO2 98%   Physical Exam Vitals signs and nursing note reviewed.  Constitutional:      General: She is not in acute distress.    Appearance: She is well-developed. She is obese. She is not toxic-appearing.  HENT:     Head: Normocephalic and atraumatic.     Nose: Congestion present.     Mouth/Throat:     Mouth: Mucous membranes are dry.     Pharynx: Oropharynx is clear. No posterior oropharyngeal erythema.  Eyes:     General: No scleral icterus.       Right eye: No discharge.        Left eye: No discharge.     Extraocular Movements: Extraocular movements intact.     Conjunctiva/sclera: Conjunctivae normal.     Pupils: Pupils are equal, round, and reactive to light.  Neck:     Musculoskeletal: Normal range of motion.  Cardiovascular:     Rate and Rhythm: Tachycardia present. Rhythm irregular.     Pulses: Normal pulses.          Radial pulses are 2+ on the right side and 2+ on the left side.     Heart sounds: Normal heart sounds.  Pulmonary:     Effort: Pulmonary effort is normal. No respiratory distress.     Breath sounds: Normal breath sounds.  Abdominal:     General: Bowel sounds are normal. There is no distension.     Tenderness: There is no abdominal tenderness. There is no guarding or rebound.  Musculoskeletal:        General: Swelling: Bilateral lower extremity  with R >2 left.     Right lower leg: 2+ Edema present.     Left lower leg: 1+ Edema present.  Skin:    General: Skin is warm and dry.  Neurological:     Mental Status: She is oriented to person, place, and time.     Comments: Fluent speech, no facial droop.  Psychiatric:        Behavior: Behavior normal.      ED Treatments / Results  Labs (all labs ordered are listed, but only abnormal results are displayed) Labs Reviewed  COMPREHENSIVE METABOLIC PANEL - Abnormal; Notable for the following components:      Result Value   Sodium 132 (*)    Glucose, Bld 103 (*)  Calcium 8.6 (*)    Total Protein 6.2 (*)    Albumin 2.8 (*)    AST 45 (*)    Alkaline Phosphatase 237 (*)    Total Bilirubin 1.3 (*)    All other components within normal limits  CBC - Abnormal; Notable for the following components:   WBC 14.9 (*)    Hemoglobin 11.7 (*)    Platelets 96 (*)    All other components within normal limits  URINALYSIS, ROUTINE W REFLEX MICROSCOPIC - Abnormal; Notable for the following components:   Color, Urine AMBER (*)    APPearance CLOUDY (*)    Hgb urine dipstick MODERATE (*)    Ketones, ur 5 (*)    Protein, ur 30 (*)    Bacteria, UA FEW (*)    All other components within normal limits  LIPASE, BLOOD    EKG EKG Interpretation  Date/Time:  Monday August 17 2018 11:49:50 EDT Ventricular Rate:  108 PR Interval:    QRS Duration: 97 QT Interval:  327 QTC Calculation: 439 R Axis:   28 Text Interpretation:  Atrial fibrillation Abnormal T, consider ischemia, diffuse leads Confirmed by Virgel Manifold [phone]) on 08/17/2018 2:49:47 PM   Radiology No results found.  Procedures Procedures (including critical care time)  Medications Ordered in ED Medications  LORazepam (ATIVAN) tablet 0.5 mg (has no administration in time range)  acetaminophen (TYLENOL) tablet 500 mg (has no administration in time range)  amoxicillin-clavulanate (AUGMENTIN) 875-125 MG per tablet 1 tablet (has  no administration in time range)  apixaban (ELIQUIS) tablet 5 mg (has no administration in time range)  atenolol (TENORMIN) tablet 50 mg (has no administration in time range)  fluticasone (FLONASE) 50 MCG/ACT nasal spray 2 spray (has no administration in time range)  lisinopril (PRINIVIL,ZESTRIL) tablet 40 mg (has no administration in time range)  sodium chloride flush (NS) 0.9 % injection 3 mL (3 mLs Intravenous Given 08/17/18 1833)  sodium chloride 0.9 % bolus 1,000 mL (1,000 mLs Intravenous New Bag/Given 08/17/18 1832)  metoprolol tartrate (LOPRESSOR) injection 5 mg (5 mg Intravenous Given 08/17/18 1700)     Initial Impression / Assessment and Plan / ED Course  I have reviewed the triage vital signs and the nursing notes.  Pertinent labs & imaging results that were available during my care of the patient were reviewed by me and considered in my medical decision making (see chart for details).  Clinical Course as of Aug 16 2057  Mon Aug 17, 2018  1145 Will initiate work-up with CBC, CMP, lipase, UA.  Patient had recent admission at [facility] for altered mental status and CT scan showed multiple strokes within the last several months.  She was discharged home x4 days ago and family feels she is unable to care for herself.  She has had increased bilateral lower extremity edema as well as decreased appetite and poor p.o. intake since discharge.  Looking through EMR patient had a sodium of 125 and WBC of 17.1 when admitted to [facility].   [PT]  4656 Consult placed for social work.  Family does not feel that patient can return home.  Reports she was independent and working 2 jobs before her confusion started a week ago.  Family is requesting patient go to rehab from ED discharge.   [KA]  1515 CBC shows leukocytosis of 14.9. Pt is currently taking Amoxicillin for a sinus infection. Her leukocytosis is improving since recent admission, will not start new antibiotics today.CMP with mild elvation of AST and  total bilirubin.  Lipase is within normal limits.  On repeat exam patient's abdomen is still benign.  Pt states she thinks her stomach is sore because she is hungry and has not been eating over the last several days. Given her negative exam will imaging not necessary at this time.  UA with moderate blood, ketones, protein, few bacteria, 0-5 WBC, patient denies urinary symptoms and squamous epithelial present, likely contaminated sample.   [KA]  1710 Patient was evaluated by social work and PT.  Looking at the PT note SNF recommended.  Patient also asking about growth of blood cultures drawn on previous admission.  I updated patient that there is been no growth after 5 days after looking EMR.   [KA]    Clinical Course User Index [KA] ,  E, PA-C    Patient's heart rate was consistently ranging from 110-130.  Will give 5 mg metoprolol for rate control.  Patient is already on atenolol daily. Heart rate improved to 100 after metoprolol.  Patient's daughter feels that she has creased confusion since being discharged from the hospital.  Patient is unable to remember things that she typically does.  She is also concerned because patient has continued to have swelling in her feet.  Patient is currently anticoagulated with Eliquis. Social work spoke with patient's daughter who agrees to have patient wait in emergency department pending placement for rehab.  Daughter is made aware this may take several days.  However daughter feels she is unable to care for her mother at home.  Home meds ordered, patient given Ativan for anxiety.  She has stopped taking her Paxil for anxiety and depression for at least 2 weeks.  Patient felt like it was not helping and her PCP recommended she stop given that she was at the maximum dose.  They were planning to make changes after her recent admission, they were holding off because they did not want change multiple medications at 1 time. Pt case discussed with Dr. Wilson Singer  who agrees with my plan.   Pt will stay in the ED pending rehab acceptance.   This note was prepared with assistance of Systems analyst. Occasional wrong-word or sound-a-like substitutions may have occurred due to the inherent limitations of voice recognition software.       Final Clinical Impressions(s) / ED Diagnoses   Final diagnoses:  Altered mental status, unspecified altered mental status type    ED Discharge Orders    None       Cherre Robins, PA-C 08/17/18 2059    Cherre Robins, PA-C 08/17/18 2318    Virgel Manifold, MD 08/19/18 1534

## 2018-08-17 NOTE — Care Management (Signed)
ED CM spoke with patient's daughter Diana Todd concerning patient awaiting bed offers from SNF. Informed her that patient will have to be in a [place] bed. Patient and daughter agreeable,  Updated ED staff.

## 2018-08-17 NOTE — Evaluation (Signed)
Physical Therapy Evaluation Patient Details Name: Diana Todd MRN: [id number] DOB: [birth year] Today's Date: 08/17/2018   History of Present Illness  Pt is a 68 y/o female presenting to ED with AMS BLE swelling and abdominal pain. Pt with recent admission secondary to similar symptoms. PMH includes anxiety, depression, HTN, DVT and CVA.   Clinical Impression  Pt admitted secondary to problem above with deficits below. Pt requiring min A +2 to perform stand pivot transfers this session to get to [facility] and then transfer to/from toilet once in the bathroom. Pt fatiguing very easily and unable to tolerate ambulation at this time. Per daughter, pt has been unable to perform necessary mobility tasks at home, therefore recommending SNF level therapies. Will continue to follow acutely to maximize functional mobility independence and safety.     Follow Up Recommendations SNF;Supervision/Assistance - 24 hour    Equipment Recommendations  None recommended by PT    Recommendations for Other Services       Precautions / Restrictions Precautions Precautions: Fall Restrictions Weight Bearing Restrictions: No      Mobility  Bed Mobility Overal bed mobility: Needs Assistance Bed Mobility: Supine to Sit;Sit to Supine     Supine to sit: Min assist Sit to supine: Min assist   General bed mobility comments: Min A for trunk elevation to come to sitting. Required increased time to move to EOB. Required min A for LE assist.   Transfers Overall transfer level: Needs assistance Equipment used: 2 person hand held assist Transfers: Sit to/from [facility] Sit to Stand: Min assist;+2 physical assistance Stand pivot transfers: Min assist;+2 physical assistance       General transfer comment: Required min a +2 for lift assist and steadying to stand and perform stand pivot transfer. Performed stand pivot transfer X4 to get to [facility] to get to bathroom. Performed stand pivot to and from  toilet once in bathroom. Unable to ambulate secondary to fatigue.   Ambulation/Gait                Stairs            Wheelchair Mobility    Modified Rankin (Stroke Patients Only)       Balance Overall balance assessment: Needs assistance Sitting-balance support: No upper extremity supported;Feet supported Sitting balance-Leahy Scale: Fair     Standing balance support: Bilateral upper extremity supported;During functional activity Standing balance-Leahy Scale: Poor Standing balance comment: Reliant on UE and external support to perform mobility tasks.                              Pertinent Vitals/Pain Pain Assessment: No/denies pain    Home Living Family/patient expects to be discharged to:: Private residence Living Arrangements: Alone Available Help at Discharge: Family;Available PRN/intermittently Type of Home: House Home Access: Stairs to enter Entrance Stairs-Rails: None Entrance Stairs-Number of Steps: 3 Home Layout: One level Home Equipment: Walker - 2 wheels      Prior Function Level of Independence: Needs assistance   Gait / Transfers Assistance Needed: Since recent discharge, Pt's daughter reports she was having to use a RW and needed assist to walk.   ADL's / Homemaking Assistance Needed: Was unable to bathe since d/c         Hand Dominance        Extremity/Trunk Assessment   Upper Extremity Assessment Upper Extremity Assessment: Generalized weakness    Lower Extremity Assessment Lower Extremity Assessment: Generalized  weakness    Cervical / Trunk Assessment Cervical / Trunk Assessment: Normal  Communication   Communication: No difficulties  Cognition Arousal/Alertness: Awake/alert Behavior During Therapy: WFL for tasks assessed/performed Overall Cognitive Status: Impaired/Different from baseline Area of Impairment: Memory;Following commands;Problem solving                     Memory: Decreased short-term  memory Following Commands: Follows one step commands with increased time     Problem Solving: Slow processing        General Comments General comments (skin integrity, edema, etc.): Pt's daughter very concerned about pt returning home.     Exercises     Assessment/Plan    PT Assessment Patient needs continued PT services  PT Problem List Decreased strength;Decreased activity tolerance;Decreased balance;Decreased mobility;Decreased cognition;Decreased knowledge of use of DME;Decreased knowledge of precautions;Decreased safety awareness       PT Treatment Interventions DME instruction;Gait training;Functional mobility training;Therapeutic activities;Therapeutic exercise;Balance training;Patient/family education    PT Goals (Current goals can be found in the Care Plan section)  Acute Rehab PT Goals Patient Stated Goal: to feel better PT Goal Formulation: With patient/family Time For Goal Achievement: 08/31/18 Potential to Achieve Goals: Good    Frequency Min 2X/week   Barriers to discharge Decreased caregiver support      Co-evaluation               AM-PAC PT "6 Clicks" Mobility  Outcome Measure Help needed turning from your back to your side while in a flat bed without using bedrails?: A Lot Help needed moving from lying on your back to sitting on the side of a flat bed without using bedrails?: A Lot Help needed moving to and from a bed to a chair (including a wheelchair)?: A Little Help needed standing up from a chair using your arms (e.g., wheelchair or bedside chair)?: A Little Help needed to walk in hospital room?: A Lot Help needed climbing 3-5 steps with a railing? : Total 6 Click Score: 13    End of Session Equipment Utilized During Treatment: Gait belt Activity Tolerance: Patient limited by fatigue Patient left: in bed;with call bell/phone within reach;with family/visitor present Nurse Communication: Mobility status;Other (comment)(pt voided) PT Visit  Diagnosis: Unsteadiness on feet (R26.81);Muscle weakness (generalized) (M62.81)    Time: 1191-4782 PT Time Calculation (min) (ACUTE ONLY): 21 min   Charges:   PT Evaluation $PT Eval Moderate Complexity: Robbins, PT, DPT  Acute Rehabilitation Services  Pager: [phone] Office: [phone]   Rudean Hitt 08/17/2018, 4:21 PM

## 2018-08-17 NOTE — NC FL2 (Addendum)
[place] LEVEL OF CARE SCREENING TOOL     IDENTIFICATION  Patient Name: Diana Todd Birthdate: Sep 05, 1950 Sex: female Admission Date (Current Location): 08/17/2018  [facility] [place] Number:  Herbalist and Address:  [facility] [place] [address], [place], [place] [address]      Provider Number: [id number]  Attending Physician Name and Address:  Virgel Manifold, MD  Relative Name and Phone Number:       Current Level of Care: Hospital Recommended Level of Care: [place] Prior Approval Number:    Date Approved/Denied:   PASRR Number:   [id number]   Discharge Plan: SNF    Current Diagnoses: Patient Active Problem List   Diagnosis Date Noted  . Moderate recurrent major depression ([place]) 08/14/2018  . Stroke ([place]) 08/14/2018  . Hyponatremia 08/10/2018    Orientation RESPIRATION BLADDER Height & Weight     Self, Time, Situation, Place  Normal Incontinent Weight:   Height:     BEHAVIORAL SYMPTOMS/MOOD NEUROLOGICAL BOWEL NUTRITION STATUS      Continent Diet(please see after visit summary)  AMBULATORY STATUS COMMUNICATION OF NEEDS Skin   Limited Assist Verbally Normal                       Personal Care Assistance Level of Assistance  Dressing, Feeding, Bathing Bathing Assistance: Maximum assistance Feeding assistance: Limited assistance Dressing Assistance: Maximum assistance     Functional Limitations Info  Sight, Hearing, Speech Sight Info: Adequate Hearing Info: Adequate Speech Info: Adequate    SPECIAL CARE FACTORS FREQUENCY  OT (By licensed OT), PT (By licensed PT)     PT Frequency: 5 x week OT Frequency: 5  x week            Contractures Contractures Info: Not present    Additional Factors Info  Allergies, Code Status Code Status Info: full code Allergies Info: macrodantin           Current Medications (08/17/2018):  This is the current hospital active medication  list Current Facility-Administered Medications  Medication Dose Route Frequency Provider Last Rate Last Dose  . sodium chloride 0.9 % bolus 1,000 mL  1,000 mL Intravenous Once Albrizze, Kaitlyn E, PA-C      . sodium chloride flush (NS) 0.9 % injection 3 mL  3 mL Intravenous Once Virgel Manifold, MD       Current Outpatient Medications  Medication Sig Dispense Refill  . amoxicillin-clavulanate (AUGMENTIN) 875-125 MG tablet Take 1 tablet by mouth every 12 (twelve) hours for 3 days. 6 tablet 0  . apixaban (ELIQUIS) 5 MG TABS tablet Take 5 mg by mouth 2 (two) times daily.    Marland Kitchen atenolol (TENORMIN) 50 MG tablet Take 50 mg by mouth daily.    . feeding supplement, ENSURE ENLIVE, (ENSURE ENLIVE) LIQD Take 237 mLs by mouth 3 (three) times daily between meals. 237 mL 12  . fluticasone (FLONASE) 50 MCG/ACT nasal spray Place 2 sprays into both nostrils daily.    . hydrocortisone 2.5 % cream Apply 1 application topically daily as needed for rash.    . lisinopril (PRINIVIL,ZESTRIL) 40 MG tablet Take 40 mg by mouth at bedtime.    . pantoprazole (PROTONIX) 40 MG tablet Take 40 mg by mouth daily as needed for nausea or heartburn.    Marland Kitchen PARoxetine (PAXIL) 30 MG tablet Take 60 mg by mouth daily.       Discharge Medications: Please see discharge  summary for a list of discharge medications.  Relevant Imaging Results:  Relevant Lab Results:   Additional Information SSN [id number]  Wetzel Bjornstad, LCSWA

## 2018-08-17 NOTE — Progress Notes (Addendum)
Inpatient Rehabilitation Admissions Coordinator  I was contacted by pt's daughter, Estill Bamberg, by phone this morning. Estill Bamberg states pt was discharged home Friday and could not be cared for at home. Pt was offered by RN CM, Jeannie Fend at [facility], an inpt rehab referral, but patient declined and asked to d/c home with Home health. I explained to daughter today by phone, that patient could not be admitted form home or the ED. I recommended that she contact her home health company, [facility], to request assistance in SNF placement from home. Daughter states she had called pt's PCP and PCP had advised her that she could not be seen in their office today, but if pt had medical needs, to bring her to the ED. I spoke with SW from ED, Jeanette Caprice, today and reiterated that I advised daughter of the above, and that pt would need SNF rehab.  Danne Baxter, RN, MSN Rehab Admissions Coordinator [phone] 08/17/2018 4:41 PM

## 2018-08-18 ENCOUNTER — Telehealth: Payer: Self-pay | Admitting: Cardiovascular Disease

## 2018-08-18 LAB — CBG MONITORING, ED: Glucose-Capillary: 102 mg/dL — ABNORMAL HIGH (ref 70–99)

## 2018-08-18 MED ORDER — ALPRAZOLAM 0.25 MG PO TABS
0.2500 mg | ORAL_TABLET | Freq: Once | ORAL | Status: AC
  Administered 2018-08-18: 0.25 mg via ORAL
  Filled 2018-08-18: qty 1

## 2018-08-18 NOTE — ED Notes (Addendum)
Pt continues to seem restless. States she's "been here all week" and wants to go home. Pt sitting on side of bed, legs through side rail. Toileting offered but declined by pt. Hallway lights turned down and pt assisted back in bed. Discussed pt with Betsey Holiday, MD.

## 2018-08-18 NOTE — Telephone Encounter (Signed)
Patient daughter Estill Bamberg calling Patient is currently admitted at [facility] ED - unable to make follow up appointment at this time Wanted to make office and Dr Rockey Situ aware

## 2018-08-18 NOTE — ED Provider Notes (Signed)
Patient becoming restless, slightly agitated.  She wants to go home.  She seems to be attempting to get out of bed, difficult to redirect.  Will attempt low-dose Xanax.   Orpah Greek, MD 08/18/18 [phone]

## 2018-08-18 NOTE — ED Notes (Signed)
Patient did not eat anything on her tray. This RN fed patient some of her lunch and she ate it

## 2018-08-18 NOTE — Progress Notes (Addendum)
3:05PM CSW started insurance authorization for SNF placement- daughter, Estill Bamberg, has selected [facility] for SNF placement. Currently still waiting on insurance authorization from [employer]. Once received patient can go to [facility].   CSW spoke with patients daughter via phone regarding disposition plans. Daughter is concerned patients weakness is more related to hr depression and not eating/ drinking. Daughter requested if TTS consult could be placed to address depression. CSW notified EDP, [place].  Kingsley Spittle, [place]  [phone]

## 2018-08-18 NOTE — BH Assessment (Addendum)
Assessment Note  Diana Todd is an 68 y.o. female.  The pt came in due to confusion and weakness.  The pt and her daughter reports the pt hasn't been eating or sleeping.  The pt denies SI and HI presently or in the past.  The pt reports to have lost 50 pounds in the last month.  She denies any major stressors other than a recent move about 2 weeks ago.  The pt is sleeping about 2 hours a night.  The pt hasn't had any mental health treatment in the past.    The pt lives alone.  She denies self harm, history of abuse and hallucinations.  The pt and her daughter stated the pt has never done drugs or drink alcohol.  Pt is dressed in scrubs. She is alert and oriented x4. Pt speaks in a clear tone, at moderate volume and normal pace. Eye contact is good. Pt's mood is pleasant. Thought process is coherent and relevant. There is no indication Pt is currently responding to internal stimuli or experiencing delusional thought content.?Pt was cooperative throughout assessment.     Diagnosis: F33.0 Major depressive disorder, Recurrent episode, Mild, with anxious distress  Past Medical History:  Past Medical History:  Diagnosis Date  . Anxiety   . DVT (deep venous thrombosis) ([place])   . Hypertension     Past Surgical History:  Procedure Laterality Date  . NO PAST SURGERIES      Family History:  Family History  Problem Relation Age of Onset  . Breast cancer Mother   . AAA (abdominal aortic aneurysm) Father     Social History:  reports that she has never smoked. She has never used smokeless tobacco. She reports that she does not drink alcohol or use drugs.  Additional Social History:  Alcohol / Drug Use Pain Medications: See MAR Prescriptions: See MAR Over the Counter: See MAR History of alcohol / drug use?: No history of alcohol / drug abuse Longest period of sobriety (when/how long): NA  CIWA: CIWA-Ar BP: 114/65 Pulse Rate: (!) 117 COWS:    Allergies:  Allergies  Allergen  Reactions  . Macrodantin [Nitrofurantoin Macrocrystal]     "welts" per pt.     Home Medications: (Not in a hospital admission)   OB/GYN Status:  No LMP recorded. Patient is postmenopausal.  General Assessment Data Location of Assessment: [facility] ED TTS Assessment: In system Is this a Tele or Face-to-Face Assessment?: Face-to-Face Is this an Initial Assessment or a Re-assessment for this encounter?: Initial Assessment Patient Accompanied by:: Adult Permission Given to speak with another: Yes Name, Relationship and Phone Number: Shella Spearing Language Other than English: No Living Arrangements: Other (Comment)(home) What gender do you identify as?: Female Marital status: Separated Maiden name: Robbins Living Arrangements: Alone Can pt return to current living arrangement?: Yes Admission Status: Voluntary Is patient capable of signing voluntary admission?: Yes Referral Source: Self/Family/Friend Insurance type: Medicare     Crisis Care Plan Living Arrangements: Alone Legal Guardian: Other:(Self) Name of Psychiatrist: none Name of Therapist: none     Risk to self with the past 6 months Suicidal Ideation: No Has patient been a risk to self within the past 6 months prior to admission? : No Suicidal Intent: No Has patient had any suicidal intent within the past 6 months prior to admission? : No Is patient at risk for suicide?: No Suicidal Plan?: No Has patient had any suicidal plan within the past 6 months prior to admission? : No Access  to Means: No What has been your use of drugs/alcohol within the last 12 months?: none Previous Attempts/Gestures: No How many times?: 0 Other Self Harm Risks: none Triggers for Past Attempts: None known Intentional Self Injurious Behavior: None Family Suicide History: No Recent stressful life event(s): Other (Comment)(recent move) Persecutory voices/beliefs?: No Depression: Yes Depression Symptoms: Insomnia Substance abuse  history and/or treatment for substance abuse?: No Suicide prevention information given to non-admitted patients: Not applicable  Risk to Others within the past 6 months Homicidal Ideation: No Does patient have any lifetime risk of violence toward others beyond the six months prior to admission? : No Thoughts of Harm to Others: No Current Homicidal Intent: No Current Homicidal Plan: No Access to Homicidal Means: No Identified Victim: pt deneis History of harm to others?: No Assessment of Violence: None Noted Violent Behavior Description: pt denies Does patient have access to weapons?: No Criminal Charges Pending?: No Does patient have a court date: No Is patient on probation?: No  Psychosis Hallucinations: None noted Delusions: None noted  Mental Status Report Appearance/Hygiene: Unremarkable, In hospital gown Eye Contact: Good Motor Activity: Unsteady Speech: Logical/coherent Level of Consciousness: Alert Mood: Pleasant Affect: Appropriate to circumstance Anxiety Level: None Thought Processes: Coherent, Relevant Judgement: Partial Orientation: Person, Place, Time, Situation, Appropriate for developmental age Obsessive Compulsive Thoughts/Behaviors: None  Cognitive Functioning Concentration: Normal Memory: Recent Intact, Remote Intact Is patient IDD: No Insight: Fair Impulse Control: Good Appetite: Poor Have you had any weight changes? : Loss Amount of the weight change? (lbs): 50 lbs Sleep: Decreased Total Hours of Sleep: 2 Vegetative Symptoms: None  ADLScreening [facility] Assessment Services) Patient's cognitive ability adequate to safely complete daily activities?: Yes Patient able to express need for assistance with ADLs?: Yes Independently performs ADLs?: Yes (appropriate for developmental age)     Prior Outpatient Therapy Prior Outpatient Therapy: No Does patient have an ACCT team?: No Does patient have Intensive In-House Services?  : No Does patient have  [facility] services? : No Does patient have P4CC services?: No  ADL Screening (condition at time of admission) Patient's cognitive ability adequate to safely complete daily activities?: Yes Patient able to express need for assistance with ADLs?: Yes Independently performs ADLs?: Yes (appropriate for developmental age)       Abuse/Neglect Assessment (Assessment to be complete while patient is alone) Abuse/Neglect Assessment Can Be Completed: Yes Physical Abuse: Denies Verbal Abuse: Denies Sexual Abuse: Denies Exploitation of patient/patient's resources: Denies Self-Neglect: Denies Values / Beliefs Cultural Requests During Hospitalization: None Spiritual Requests During Hospitalization: None Consults Spiritual Care Consult Needed: No Social Work Consult Needed: No            Disposition:  Disposition Initial Assessment Completed for this Encounter: Yes   NP Jefferson Fuel recommends the pt follow up with OPT.  The pt's RN was made aware of the recommendation.  On Site Evaluation by:   Reviewed with Physician:    Enzo Montgomery 08/18/2018 3:10 PM

## 2018-08-18 NOTE — ED Notes (Signed)
Breakfast tray ordered 

## 2018-08-19 DIAGNOSIS — G9341 Metabolic encephalopathy: Secondary | ICD-10-CM | POA: Diagnosis not present

## 2018-08-19 DIAGNOSIS — R7989 Other specified abnormal findings of blood chemistry: Secondary | ICD-10-CM | POA: Diagnosis not present

## 2018-08-19 DIAGNOSIS — I82502 Chronic embolism and thrombosis of unspecified deep veins of left lower extremity: Secondary | ICD-10-CM | POA: Diagnosis not present

## 2018-08-19 DIAGNOSIS — R1084 Generalized abdominal pain: Secondary | ICD-10-CM | POA: Diagnosis not present

## 2018-08-19 DIAGNOSIS — E559 Vitamin D deficiency, unspecified: Secondary | ICD-10-CM | POA: Diagnosis not present

## 2018-08-19 DIAGNOSIS — N3289 Other specified disorders of bladder: Secondary | ICD-10-CM | POA: Diagnosis not present

## 2018-08-19 DIAGNOSIS — Z86718 Personal history of other venous thrombosis and embolism: Secondary | ICD-10-CM | POA: Diagnosis not present

## 2018-08-19 DIAGNOSIS — D735 Infarction of spleen: Secondary | ICD-10-CM | POA: Diagnosis not present

## 2018-08-19 DIAGNOSIS — R1030 Lower abdominal pain, unspecified: Secondary | ICD-10-CM | POA: Diagnosis not present

## 2018-08-19 DIAGNOSIS — R2689 Other abnormalities of gait and mobility: Secondary | ICD-10-CM | POA: Diagnosis not present

## 2018-08-19 DIAGNOSIS — C7800 Secondary malignant neoplasm of unspecified lung: Secondary | ICD-10-CM | POA: Diagnosis not present

## 2018-08-19 DIAGNOSIS — E876 Hypokalemia: Secondary | ICD-10-CM | POA: Diagnosis not present

## 2018-08-19 DIAGNOSIS — I110 Hypertensive heart disease with heart failure: Secondary | ICD-10-CM | POA: Diagnosis not present

## 2018-08-19 DIAGNOSIS — D696 Thrombocytopenia, unspecified: Secondary | ICD-10-CM | POA: Diagnosis not present

## 2018-08-19 DIAGNOSIS — I5022 Chronic systolic (congestive) heart failure: Secondary | ICD-10-CM | POA: Diagnosis not present

## 2018-08-19 DIAGNOSIS — R630 Anorexia: Secondary | ICD-10-CM | POA: Diagnosis not present

## 2018-08-19 DIAGNOSIS — N179 Acute kidney failure, unspecified: Secondary | ICD-10-CM | POA: Diagnosis not present

## 2018-08-19 DIAGNOSIS — R4182 Altered mental status, unspecified: Secondary | ICD-10-CM | POA: Diagnosis not present

## 2018-08-19 DIAGNOSIS — C787 Secondary malignant neoplasm of liver and intrahepatic bile duct: Secondary | ICD-10-CM | POA: Diagnosis not present

## 2018-08-19 DIAGNOSIS — I69891 Dysphagia following other cerebrovascular disease: Secondary | ICD-10-CM | POA: Diagnosis not present

## 2018-08-19 DIAGNOSIS — Z66 Do not resuscitate: Secondary | ICD-10-CM | POA: Diagnosis not present

## 2018-08-19 DIAGNOSIS — R21 Rash and other nonspecific skin eruption: Secondary | ICD-10-CM | POA: Diagnosis not present

## 2018-08-19 DIAGNOSIS — F419 Anxiety disorder, unspecified: Secondary | ICD-10-CM | POA: Diagnosis not present

## 2018-08-19 DIAGNOSIS — F331 Major depressive disorder, recurrent, moderate: Secondary | ICD-10-CM | POA: Diagnosis not present

## 2018-08-19 DIAGNOSIS — C779 Secondary and unspecified malignant neoplasm of lymph node, unspecified: Secondary | ICD-10-CM | POA: Diagnosis not present

## 2018-08-19 DIAGNOSIS — R531 Weakness: Secondary | ICD-10-CM | POA: Diagnosis not present

## 2018-08-19 DIAGNOSIS — I429 Cardiomyopathy, unspecified: Secondary | ICD-10-CM | POA: Diagnosis not present

## 2018-08-19 DIAGNOSIS — Z803 Family history of malignant neoplasm of breast: Secondary | ICD-10-CM | POA: Diagnosis not present

## 2018-08-19 DIAGNOSIS — E872 Acidosis: Secondary | ICD-10-CM | POA: Diagnosis not present

## 2018-08-19 DIAGNOSIS — I96 Gangrene, not elsewhere classified: Secondary | ICD-10-CM | POA: Diagnosis not present

## 2018-08-19 DIAGNOSIS — I10 Essential (primary) hypertension: Secondary | ICD-10-CM | POA: Diagnosis not present

## 2018-08-19 DIAGNOSIS — M6281 Muscle weakness (generalized): Secondary | ICD-10-CM | POA: Diagnosis not present

## 2018-08-19 DIAGNOSIS — I959 Hypotension, unspecified: Secondary | ICD-10-CM | POA: Diagnosis not present

## 2018-08-19 DIAGNOSIS — E871 Hypo-osmolality and hyponatremia: Secondary | ICD-10-CM | POA: Diagnosis not present

## 2018-08-19 DIAGNOSIS — I639 Cerebral infarction, unspecified: Secondary | ICD-10-CM | POA: Diagnosis not present

## 2018-08-19 DIAGNOSIS — D692 Other nonthrombocytopenic purpura: Secondary | ICD-10-CM | POA: Diagnosis not present

## 2018-08-19 DIAGNOSIS — Z8673 Personal history of transient ischemic attack (TIA), and cerebral infarction without residual deficits: Secondary | ICD-10-CM | POA: Diagnosis not present

## 2018-08-19 DIAGNOSIS — R600 Localized edema: Secondary | ICD-10-CM | POA: Diagnosis not present

## 2018-08-19 DIAGNOSIS — K769 Liver disease, unspecified: Secondary | ICD-10-CM | POA: Diagnosis not present

## 2018-08-19 DIAGNOSIS — K869 Disease of pancreas, unspecified: Secondary | ICD-10-CM | POA: Diagnosis not present

## 2018-08-19 DIAGNOSIS — I5020 Unspecified systolic (congestive) heart failure: Secondary | ICD-10-CM | POA: Diagnosis not present

## 2018-08-19 DIAGNOSIS — D65 Disseminated intravascular coagulation [defibrination syndrome]: Secondary | ICD-10-CM | POA: Diagnosis not present

## 2018-08-19 DIAGNOSIS — Z515 Encounter for palliative care: Secondary | ICD-10-CM | POA: Diagnosis not present

## 2018-08-19 DIAGNOSIS — Z7189 Other specified counseling: Secondary | ICD-10-CM | POA: Diagnosis not present

## 2018-08-19 DIAGNOSIS — E860 Dehydration: Secondary | ICD-10-CM | POA: Diagnosis not present

## 2018-08-19 DIAGNOSIS — K8689 Other specified diseases of pancreas: Secondary | ICD-10-CM | POA: Diagnosis not present

## 2018-08-19 DIAGNOSIS — R911 Solitary pulmonary nodule: Secondary | ICD-10-CM | POA: Diagnosis not present

## 2018-08-19 DIAGNOSIS — Z6841 Body Mass Index (BMI) 40.0 and over, adult: Secondary | ICD-10-CM | POA: Diagnosis not present

## 2018-08-19 DIAGNOSIS — E785 Hyperlipidemia, unspecified: Secondary | ICD-10-CM | POA: Diagnosis not present

## 2018-08-19 DIAGNOSIS — I4891 Unspecified atrial fibrillation: Secondary | ICD-10-CM | POA: Diagnosis not present

## 2018-08-19 DIAGNOSIS — C259 Malignant neoplasm of pancreas, unspecified: Secondary | ICD-10-CM | POA: Diagnosis not present

## 2018-08-19 DIAGNOSIS — G9340 Encephalopathy, unspecified: Secondary | ICD-10-CM | POA: Diagnosis not present

## 2018-08-19 DIAGNOSIS — E669 Obesity, unspecified: Secondary | ICD-10-CM | POA: Diagnosis not present

## 2018-08-19 NOTE — ED Notes (Signed)
Ordered a heart healthy breakfast--Diana Todd  

## 2018-08-19 NOTE — ED Notes (Signed)
Family is at bedside to transport the patient.

## 2018-08-19 NOTE — ED Notes (Signed)
Patient and patients daughter verbalized understanding of discharge instructions. Opportunity for questioning and answers were provided to patient and family. Armband removed by staff, pt discharged from ED with family for transport to nursing home.

## 2018-08-19 NOTE — ED Notes (Signed)
Breakfast at bedside.

## 2018-08-19 NOTE — ED Notes (Signed)
Report has been called to the RN at [facility] who is accepting this patient.

## 2018-08-19 NOTE — Progress Notes (Signed)
CSW has received insurance authorization for [employer] [facility]. Authorization number: [id number]   CSW has notified patients daughter who will provide transportation to facility. Please call report [phone].   Facility is ready for patient asap.    Kingsley Spittle, [place]  [phone]

## 2018-08-20 DIAGNOSIS — F419 Anxiety disorder, unspecified: Secondary | ICD-10-CM | POA: Diagnosis not present

## 2018-08-20 DIAGNOSIS — I10 Essential (primary) hypertension: Secondary | ICD-10-CM | POA: Diagnosis not present

## 2018-08-20 DIAGNOSIS — R531 Weakness: Secondary | ICD-10-CM | POA: Diagnosis not present

## 2018-08-24 DIAGNOSIS — N3289 Other specified disorders of bladder: Secondary | ICD-10-CM | POA: Diagnosis not present

## 2018-08-24 DIAGNOSIS — E559 Vitamin D deficiency, unspecified: Secondary | ICD-10-CM | POA: Diagnosis not present

## 2018-08-24 DIAGNOSIS — R1030 Lower abdominal pain, unspecified: Secondary | ICD-10-CM | POA: Diagnosis not present

## 2018-08-24 DIAGNOSIS — R630 Anorexia: Secondary | ICD-10-CM | POA: Diagnosis not present

## 2018-08-26 DIAGNOSIS — R1030 Lower abdominal pain, unspecified: Secondary | ICD-10-CM | POA: Diagnosis not present

## 2018-08-26 DIAGNOSIS — R600 Localized edema: Secondary | ICD-10-CM | POA: Diagnosis not present

## 2018-08-26 DIAGNOSIS — R630 Anorexia: Secondary | ICD-10-CM | POA: Diagnosis not present

## 2018-08-26 DIAGNOSIS — D696 Thrombocytopenia, unspecified: Secondary | ICD-10-CM | POA: Diagnosis not present

## 2018-08-27 ENCOUNTER — Other Ambulatory Visit: Payer: Self-pay | Admitting: *Deleted

## 2018-08-27 NOTE — Patient Outreach (Signed)
[facility]) Care Management  08/27/2018  DARLINA MCCAUGHEY 1950/07/08 [id number]  Collaboration with [facility], patient does not have a clear discharge plan at this time.  Message left for Eugenie Birks, discharge planner at facility regarding patient.  Plan to monitor and assess for any [facility] care management needs.  Royetta Crochet. Laymond Purser, MSN, RN, [employer] [place] [phone]) Business Cell  [phone]) Toll Free Office

## 2018-08-29 ENCOUNTER — Inpatient Hospital Stay (HOSPITAL_COMMUNITY)
Admission: EM | Admit: 2018-08-29 | Discharge: 2018-08-30 | DRG: 438 | Disposition: A | Discharge status: Hospice | Payer: PPO | Attending: Internal Medicine | Admitting: Internal Medicine

## 2018-08-29 ENCOUNTER — Emergency Department (HOSPITAL_COMMUNITY): Payer: PPO

## 2018-08-29 ENCOUNTER — Other Ambulatory Visit: Payer: Self-pay

## 2018-08-29 ENCOUNTER — Encounter (HOSPITAL_COMMUNITY): Payer: Self-pay | Admitting: Emergency Medicine

## 2018-08-29 DIAGNOSIS — E860 Dehydration: Secondary | ICD-10-CM

## 2018-08-29 DIAGNOSIS — R21 Rash and other nonspecific skin eruption: Secondary | ICD-10-CM | POA: Diagnosis not present

## 2018-08-29 DIAGNOSIS — R7989 Other specified abnormal findings of blood chemistry: Secondary | ICD-10-CM

## 2018-08-29 DIAGNOSIS — I96 Gangrene, not elsewhere classified: Secondary | ICD-10-CM | POA: Diagnosis not present

## 2018-08-29 DIAGNOSIS — Z803 Family history of malignant neoplasm of breast: Secondary | ICD-10-CM

## 2018-08-29 DIAGNOSIS — R911 Solitary pulmonary nodule: Secondary | ICD-10-CM

## 2018-08-29 DIAGNOSIS — G9340 Encephalopathy, unspecified: Secondary | ICD-10-CM | POA: Diagnosis not present

## 2018-08-29 DIAGNOSIS — M2550 Pain in unspecified joint: Secondary | ICD-10-CM | POA: Diagnosis not present

## 2018-08-29 DIAGNOSIS — Z86718 Personal history of other venous thrombosis and embolism: Secondary | ICD-10-CM

## 2018-08-29 DIAGNOSIS — Z7189 Other specified counseling: Secondary | ICD-10-CM

## 2018-08-29 DIAGNOSIS — C779 Secondary and unspecified malignant neoplasm of lymph node, unspecified: Secondary | ICD-10-CM | POA: Diagnosis not present

## 2018-08-29 DIAGNOSIS — D65 Disseminated intravascular coagulation [defibrination syndrome]: Secondary | ICD-10-CM | POA: Diagnosis present

## 2018-08-29 DIAGNOSIS — Z66 Do not resuscitate: Secondary | ICD-10-CM | POA: Diagnosis present

## 2018-08-29 DIAGNOSIS — F419 Anxiety disorder, unspecified: Secondary | ICD-10-CM | POA: Diagnosis not present

## 2018-08-29 DIAGNOSIS — E669 Obesity, unspecified: Secondary | ICD-10-CM | POA: Diagnosis present

## 2018-08-29 DIAGNOSIS — C259 Malignant neoplasm of pancreas, unspecified: Secondary | ICD-10-CM

## 2018-08-29 DIAGNOSIS — Z515 Encounter for palliative care: Secondary | ICD-10-CM | POA: Diagnosis not present

## 2018-08-29 DIAGNOSIS — I110 Hypertensive heart disease with heart failure: Secondary | ICD-10-CM | POA: Diagnosis not present

## 2018-08-29 DIAGNOSIS — I959 Hypotension, unspecified: Secondary | ICD-10-CM | POA: Diagnosis not present

## 2018-08-29 DIAGNOSIS — D692 Other nonthrombocytopenic purpura: Secondary | ICD-10-CM | POA: Diagnosis not present

## 2018-08-29 DIAGNOSIS — D696 Thrombocytopenia, unspecified: Secondary | ICD-10-CM

## 2018-08-29 DIAGNOSIS — D735 Infarction of spleen: Secondary | ICD-10-CM

## 2018-08-29 DIAGNOSIS — Z6841 Body Mass Index (BMI) 40.0 and over, adult: Secondary | ICD-10-CM

## 2018-08-29 DIAGNOSIS — I429 Cardiomyopathy, unspecified: Secondary | ICD-10-CM | POA: Diagnosis not present

## 2018-08-29 DIAGNOSIS — K8689 Other specified diseases of pancreas: Secondary | ICD-10-CM | POA: Diagnosis present

## 2018-08-29 DIAGNOSIS — I080 Rheumatic disorders of both mitral and aortic valves: Secondary | ICD-10-CM

## 2018-08-29 DIAGNOSIS — N179 Acute kidney failure, unspecified: Secondary | ICD-10-CM

## 2018-08-29 DIAGNOSIS — Z7401 Bed confinement status: Secondary | ICD-10-CM | POA: Diagnosis not present

## 2018-08-29 DIAGNOSIS — C787 Secondary malignant neoplasm of liver and intrahepatic bile duct: Secondary | ICD-10-CM | POA: Diagnosis not present

## 2018-08-29 DIAGNOSIS — I4891 Unspecified atrial fibrillation: Secondary | ICD-10-CM

## 2018-08-29 DIAGNOSIS — R4182 Altered mental status, unspecified: Secondary | ICD-10-CM | POA: Diagnosis not present

## 2018-08-29 DIAGNOSIS — K869 Disease of pancreas, unspecified: Secondary | ICD-10-CM | POA: Diagnosis not present

## 2018-08-29 DIAGNOSIS — K769 Liver disease, unspecified: Secondary | ICD-10-CM

## 2018-08-29 DIAGNOSIS — Z8673 Personal history of transient ischemic attack (TIA), and cerebral infarction without residual deficits: Secondary | ICD-10-CM | POA: Diagnosis not present

## 2018-08-29 DIAGNOSIS — E872 Acidosis: Secondary | ICD-10-CM

## 2018-08-29 DIAGNOSIS — C7800 Secondary malignant neoplasm of unspecified lung: Secondary | ICD-10-CM | POA: Diagnosis not present

## 2018-08-29 DIAGNOSIS — I5020 Unspecified systolic (congestive) heart failure: Secondary | ICD-10-CM

## 2018-08-29 DIAGNOSIS — R634 Abnormal weight loss: Secondary | ICD-10-CM

## 2018-08-29 DIAGNOSIS — R1084 Generalized abdominal pain: Secondary | ICD-10-CM | POA: Diagnosis not present

## 2018-08-29 DIAGNOSIS — Z881 Allergy status to other antibiotic agents status: Secondary | ICD-10-CM

## 2018-08-29 DIAGNOSIS — Z7901 Long term (current) use of anticoagulants: Secondary | ICD-10-CM

## 2018-08-29 DIAGNOSIS — R5381 Other malaise: Secondary | ICD-10-CM | POA: Diagnosis not present

## 2018-08-29 DIAGNOSIS — I5022 Chronic systolic (congestive) heart failure: Secondary | ICD-10-CM | POA: Diagnosis not present

## 2018-08-29 LAB — LACTIC ACID, PLASMA
Lactic Acid, Venous: 2.5 mmol/L (ref 0.5–1.9)
Lactic Acid, Venous: 2.2 mmol/L (ref 0.5–1.9)
Lactic Acid, Venous: 1.6 mmol/L (ref 0.5–1.9)
Lactic Acid, Venous: 1.9 mmol/L (ref 0.5–1.9)

## 2018-08-29 LAB — COMPREHENSIVE METABOLIC PANEL
ALT: 28 U/L (ref 0–44)
AST: 39 U/L (ref 15–41)
Albumin: 3.1 g/dL — ABNORMAL LOW (ref 3.5–5.0)
Alkaline Phosphatase: 293 U/L — ABNORMAL HIGH (ref 38–126)
Anion gap: 17 — ABNORMAL HIGH (ref 5–15)
BUN: 34 mg/dL — ABNORMAL HIGH (ref 8–23)
CO2: 18 mmol/L — ABNORMAL LOW (ref 22–32)
Calcium: 8.9 mg/dL (ref 8.9–10.3)
Chloride: 105 mmol/L (ref 98–111)
Creatinine, Ser: 1.42 mg/dL — ABNORMAL HIGH (ref 0.44–1.00)
GFR calc Af Amer: 44 mL/min — ABNORMAL LOW (ref >60)
GFR calc non Af Amer: 38 mL/min — ABNORMAL LOW (ref >60)
GLUCOSE: 123 mg/dL — AB (ref 70–99)
POTASSIUM: 3.5 mmol/L (ref 3.5–5.1)
Sodium: 140 mmol/L (ref 135–145)
TOTAL PROTEIN: 6.3 g/dL — AB (ref 6.5–8.1)
Total Bilirubin: 1.6 mg/dL — ABNORMAL HIGH (ref 0.3–1.2)

## 2018-08-29 LAB — ABO/RH: ABO/RH(D): A NEG

## 2018-08-29 LAB — DIC (DISSEMINATED INTRAVASCULAR COAGULATION)PANEL
D-Dimer, Quant: 6.54 ug/mL-FEU — ABNORMAL HIGH (ref 0.00–0.50)
Fibrinogen: 274 mg/dL (ref 210–475)
INR: 5 (ref 0.8–1.2)
Platelets: 39 10*3/uL — ABNORMAL LOW (ref 150–400)
Prothrombin Time: 45.3 seconds — ABNORMAL HIGH (ref 11.4–15.2)
Smear Review: NONE SEEN
aPTT: 54 seconds — ABNORMAL HIGH (ref 24–36)

## 2018-08-29 LAB — URINALYSIS, ROUTINE W REFLEX MICROSCOPIC
Bilirubin Urine: NEGATIVE
GLUCOSE, UA: NEGATIVE mg/dL
Hgb urine dipstick: NEGATIVE
KETONES UR: NEGATIVE mg/dL
LEUKOCYTE UA: NEGATIVE
Nitrite: NEGATIVE
Protein, ur: NEGATIVE mg/dL
Specific Gravity, Urine: 1.015 (ref 1.005–1.030)
pH: 5 (ref 5.0–8.0)

## 2018-08-29 LAB — CBC WITH DIFFERENTIAL/PLATELET
Abs Immature Granulocytes: 0.07 10*3/uL (ref 0.00–0.07)
Basophils Absolute: 0 10*3/uL (ref 0.0–0.1)
Basophils Relative: 0 %
Eosinophils Absolute: 0 10*3/uL (ref 0.0–0.5)
Eosinophils Relative: 0 %
HCT: 36.2 % (ref 36.0–46.0)
Hemoglobin: 11.5 g/dL — ABNORMAL LOW (ref 12.0–15.0)
Immature Granulocytes: 1 %
Lymphocytes Relative: 8 %
Lymphs Abs: 1.2 10*3/uL (ref 0.7–4.0)
MCH: 29.8 pg (ref 26.0–34.0)
MCHC: 31.8 g/dL (ref 30.0–36.0)
MCV: 93.8 fL (ref 80.0–100.0)
Monocytes Absolute: 0.8 10*3/uL (ref 0.1–1.0)
Monocytes Relative: 6 %
NEUTROS ABS: 12.6 10*3/uL — AB (ref 1.7–7.7)
Neutrophils Relative %: 85 %
PLATELETS: 46 10*3/uL — AB (ref 150–400)
RBC: 3.86 MIL/uL — ABNORMAL LOW (ref 3.87–5.11)
RDW: 19.7 % — ABNORMAL HIGH (ref 11.5–15.5)
WBC: 14.7 10*3/uL — ABNORMAL HIGH (ref 4.0–10.5)
nRBC: 0 % (ref 0.0–0.2)

## 2018-08-29 LAB — LACTATE DEHYDROGENASE: LDH: 467 U/L — ABNORMAL HIGH (ref 98–192)

## 2018-08-29 LAB — CBC
HCT: 32.9 % — ABNORMAL LOW (ref 36.0–46.0)
HCT: 32.2 % — ABNORMAL LOW (ref 36.0–46.0)
Hemoglobin: 10 g/dL — ABNORMAL LOW (ref 12.0–15.0)
Hemoglobin: 10.2 g/dL — ABNORMAL LOW (ref 12.0–15.0)
MCH: 28.3 pg (ref 26.0–34.0)
MCH: 29.2 pg (ref 26.0–34.0)
MCHC: 30.4 g/dL (ref 30.0–36.0)
MCHC: 31.7 g/dL (ref 30.0–36.0)
MCV: 93.2 fL (ref 80.0–100.0)
MCV: 92.3 fL (ref 80.0–100.0)
Platelets: 40 10*3/uL — ABNORMAL LOW (ref 150–400)
Platelets: 39 10*3/uL — ABNORMAL LOW (ref 150–400)
RBC: 3.53 MIL/uL — ABNORMAL LOW (ref 3.87–5.11)
RBC: 3.49 MIL/uL — ABNORMAL LOW (ref 3.87–5.11)
RDW: 19.3 % — ABNORMAL HIGH (ref 11.5–15.5)
RDW: 19.6 % — ABNORMAL HIGH (ref 11.5–15.5)
WBC: 11.6 10*3/uL — ABNORMAL HIGH (ref 4.0–10.5)
WBC: 13.4 10*3/uL — ABNORMAL HIGH (ref 4.0–10.5)
nRBC: 0 % (ref 0.0–0.2)
nRBC: 0 % (ref 0.0–0.2)

## 2018-08-29 LAB — TROPONIN I
Troponin I: 0.09 ng/mL (ref <0.03)
Troponin I: 0.08 ng/mL (ref <0.03)

## 2018-08-29 LAB — TYPE AND SCREEN
ABO/RH(D): A NEG
Antibody Screen: NEGATIVE

## 2018-08-29 LAB — IMMATURE PLATELET FRACTION: Immature Platelet Fraction: 8.1 % (ref 1.2–8.6)

## 2018-08-29 MED ORDER — SODIUM CHLORIDE 0.9 % IV BOLUS
1000.0000 mL | Freq: Once | INTRAVENOUS | Status: AC
  Administered 2018-08-29: 1000 mL via INTRAVENOUS

## 2018-08-29 MED ORDER — IOHEXOL 300 MG/ML  SOLN
75.0000 mL | Freq: Once | INTRAMUSCULAR | Status: AC | PRN
  Administered 2018-08-29: 75 mL via INTRAVENOUS

## 2018-08-29 MED ORDER — SODIUM CHLORIDE 0.9 % IV BOLUS: 1000.0000 mL | Freq: Once | INTRAVENOUS | Status: DC

## 2018-08-29 MED ORDER — SODIUM CHLORIDE 0.9 % IV BOLUS
1000.0000 mL | Freq: Once | INTRAVENOUS | Status: DC
  Administered 2018-08-29: 1000 mL via INTRAVENOUS

## 2018-08-29 MED ORDER — SODIUM CHLORIDE 0.9% IV SOLUTION
Freq: Once | INTRAVENOUS | Status: AC
  Administered 2018-08-29: 22:00:00 via INTRAVENOUS

## 2018-08-29 MED ORDER — POTASSIUM CHLORIDE IN NACL 20-0.9 MEQ/L-% IV SOLN
INTRAVENOUS | Status: DC
  Administered 2018-08-29: 19:00:00 via INTRAVENOUS
  Filled 2018-08-29: qty 1000

## 2018-08-29 NOTE — ED Provider Notes (Signed)
[facility] EMERGENCY DEPARTMENT Provider Note   CSN: [id number] Arrival date & time: 08/29/18  9833    History   Chief Complaint Chief Complaint  Patient presents with  . Rash  . Hypotension    HPI Diana Todd is a 68 y.o. female.     Pt presents to the ED today with a rash.  She had recent strokes and was recently transferred to a SNF.  Pt had a stroke and new onset afib on 3/2.  She was d/c to home on 3/6.  Daughter felt like she could not get around by herself, so she brought her to the ED on 3/9.  She stayed as a boarder until 3/11 when a facility was found.  Per EMS, pt has had a rash for several days.  She is a poor historian and is unable to give any hx.  She denies any pain.  Per EMS, neurologic status is at baseline.  Her daughter said she is more confused as well.  Daughter also said she's not had an appetite and has been complaining of RUQ pain.     Past Medical History:  Diagnosis Date  . Anxiety   . DVT (deep venous thrombosis) ([place])   . Hypertension   afib  Patient Active Problem List   Diagnosis Date Noted  . Moderate recurrent major depression ([place]) 08/14/2018  . Stroke ([place]) 08/14/2018  . Hyponatremia 08/10/2018    Past Surgical History:  Procedure Laterality Date  . NO PAST SURGERIES       OB History   No obstetric history on file.      Home Medications    Prior to Admission medications   Medication Sig Start Date End Date Taking? Authorizing Provider  apixaban (ELIQUIS) 5 MG TABS tablet Take 5 mg by mouth 2 (two) times daily. 07/28/18 08/29/18  [provider]  atenolol (TENORMIN) 50 MG tablet Take 50 mg by mouth daily. 07/15/18   [provider]  feeding supplement, ENSURE ENLIVE, (ENSURE ENLIVE) LIQD Take 237 mLs by mouth 3 (three) times daily between meals. Patient not taking: Reported on 08/17/2018 08/14/18   Bettey Costa, MD  lisinopril (PRINIVIL,ZESTRIL) 40 MG tablet Take 40 mg by mouth daily.   07/15/18   [provider]  pantoprazole (PROTONIX) 40 MG tablet Take 40 mg by mouth daily.    [provider]  PARoxetine (PAXIL) 30 MG tablet Take 60 mg by mouth daily.    [provider]  traZODone (DESYREL) 50 MG tablet Take 50 mg by mouth at bedtime.    [provider]    Family History Family History  Problem Relation Age of Onset  . Breast cancer Mother   . AAA (abdominal aortic aneurysm) Father     Social History Social History   Tobacco Use  . Smoking status: Never Smoker  . Smokeless tobacco: Never Used  Substance Use Topics  . Alcohol use: Never    Frequency: Never  . Drug use: Never     Allergies   Macrodantin [nitrofurantoin macrocrystal]   Review of Systems Review of Systems  Constitutional: Positive for appetite change.  Gastrointestinal: Positive for abdominal pain.  Skin: Positive for rash.  All other systems reviewed and are negative.    Physical Exam Updated Vital Signs BP (!) 95/55   Pulse 92   Temp 97.8 F (36.6 C) (Oral)   Resp 14   Ht 5\' 3"  (1.6 m)   Wt 103.9 kg  SpO2 100%   BMI 40.57 kg/m   Physical Exam Vitals signs and nursing note reviewed.  Constitutional:      Appearance: She is obese. She is ill-appearing.  HENT:     Head: Normocephalic and atraumatic.     Right Ear: External ear normal.     Left Ear: External ear normal.     Nose: Nose normal.     Mouth/Throat:     Mouth: Mucous membranes are moist.  Eyes:     Extraocular Movements: Extraocular movements intact.     Conjunctiva/sclera: Conjunctivae normal.     Pupils: Pupils are equal, round, and reactive to light.  Neck:     Musculoskeletal: Normal range of motion and neck supple.  Cardiovascular:     Rate and Rhythm: Normal rate and regular rhythm.     Pulses: Normal pulses.     Heart sounds: Normal heart sounds.  Pulmonary:     Effort: Pulmonary effort is normal.     Breath sounds: Normal breath sounds.  Abdominal:      General: Abdomen is flat.     Palpations: Abdomen is soft.  Musculoskeletal:     Right lower leg: Edema present.     Left lower leg: Edema present.  Skin:    General: Skin is warm.     Capillary Refill: Capillary refill takes less than 2 seconds.     Comments: Petechial rash all over body.  See pictures.  Neurological:     Mental Status: She is alert. Mental status is at baseline.  Psychiatric:        Mood and Affect: Mood normal.         The above picture is pt's LLQ ED Treatments / Results  Labs (all labs ordered are listed, but only abnormal results are displayed) Labs Reviewed  COMPREHENSIVE METABOLIC PANEL - Abnormal; Notable for the following components:      Result Value   CO2 18 (*)    Glucose, Bld 123 (*)    BUN 34 (*)    Creatinine, Ser 1.42 (*)    Total Protein 6.3 (*)    Albumin 3.1 (*)    Alkaline Phosphatase 293 (*)    Total Bilirubin 1.6 (*)    GFR calc non Af Amer 38 (*)    GFR calc Af Amer 44 (*)    Anion gap 17 (*)    All other components within normal limits  CBC WITH DIFFERENTIAL/PLATELET - Abnormal; Notable for the following components:   WBC 14.7 (*)    RBC 3.86 (*)    Hemoglobin 11.5 (*)    RDW 19.7 (*)    Platelets 46 (*)    Neutro Abs 12.6 (*)    All other components within normal limits  TROPONIN I - Abnormal; Notable for the following components:   Troponin I 0.09 (*)    All other components within normal limits  LACTIC ACID, PLASMA - Abnormal; Notable for the following components:   Lactic Acid, Venous 2.5 (*)    All other components within normal limits  CULTURE, BLOOD (ROUTINE X 2)  CULTURE, BLOOD (ROUTINE X 2)  URINE CULTURE  URINALYSIS, ROUTINE W REFLEX MICROSCOPIC  IMMATURE PLATELET FRACTION  LACTIC ACID, PLASMA  TYPE AND SCREEN  ABO/RH    EKG EKG Interpretation  Date/Time:  Saturday August 29 2018 10:11:50 EDT Ventricular Rate:  89 PR Interval:    QRS Duration: 102 QT Interval:  410 QTC Calculation: 499 R Axis:    11  Text Interpretation:  Atrial fibrillation Repol abnrm suggests ischemia, anterolateral Deeper t wave changes anterolateral Confirmed by Isla Pence [phone]) on 08/29/2018 10:16:51 AM   Radiology Ct Head Wo Contrast  Result Date: 08/29/2018 CLINICAL DATA:  Altered mental status. Recently started on Coumadin. EXAM: CT HEAD WITHOUT CONTRAST TECHNIQUE: Contiguous axial images were obtained from the base of the skull through the vertex without intravenous contrast. COMPARISON:  Head CT dated 08/10/2018. Brain MRI dated 08/11/2018. FINDINGS: Brain: Mild generalized parenchymal volume loss with commensurate dilatation of the ventricles and sulci. No hydrocephalus. Mild chronic small vessel ischemic changes within the bilateral periventricular white matter regions. No mass, hemorrhage, edema or other evidence of acute parenchymal abnormality. No extra-axial hemorrhage. Vascular: Chronic calcified atherosclerotic changes of the large vessels at the skull base. No unexpected hyperdense vessel. Skull: Normal. Negative for fracture or focal lesion. Sinuses/Orbits: Mucosal thickening within the LEFT maxillary sinus, of uncertain age. Paranasal sinuses otherwise clear. Periorbital and retro-orbital soft tissues are unremarkable. Other: None. IMPRESSION: 1. No acute findings. No intracranial mass, hemorrhage or edema. 2. Mild atrophy and chronic small vessel ischemic changes in the white matter. Electronically Signed   By: Franki Cabot M.D.   On: 08/29/2018 13:03   Ct Abdomen Pelvis W Contrast  Result Date: 08/29/2018 CLINICAL DATA:  Generalized abdominal pain beginning today. Recently started on Coumadin. EXAM: CT ABDOMEN AND PELVIS WITH CONTRAST TECHNIQUE: Multidetector CT imaging of the abdomen and pelvis was performed using the standard protocol following bolus administration of intravenous contrast. CONTRAST:  32mL OMNIPAQUE IOHEXOL 300 MG/ML  SOLN COMPARISON:  None. FINDINGS: Lower chest: Masslike  consolidation at the RIGHT lung base, overlying the RIGHT hemidiaphragm and abutting the posterior pleural surface, measuring 3.1 cm (series 5, image 35). Pleural thickening and/or small pleural effusion at the RIGHT lung base. Hepatobiliary: Numerous hypodense masses throughout the bilateral liver lobes, largest located within the anterior superior RIGHT liver lobe measuring 4.8 cm (series 3, image 17). Next largest mass is seen exophytic to the posterior inferior margin of the RIGHT liver lobe measuring 3.7 cm (series 3, image 42). Small perihepatic free fluid. Gallbladder is unremarkable. No bile duct dilatation seen. Pancreas: Pancreatic tail mass measures approximately 6.2 x 4.5 cm (series 3, image 21). Spleen: Wedge-shaped peripheral hypodense lesion within the spleen measures 4 cm greatest dimension, suspected splenic infarct. Adrenals/Urinary Tract: Adrenal glands are unremarkable. Small hypodense masses within the RIGHT kidney, too small to characterize. No hydronephrosis bilaterally. Bladder is unremarkable. Stomach/Bowel: No dilated large or small bowel loops. Stomach is unremarkable, partially decompressed. Vascular/Lymphatic: Suspected obstruction/occlusion of the splenic vein. No other acute or significant vascular abnormality identified. Confluent lymphadenopathy at the porta hepatis, likely metastatic. Additional adenopathy versus metastatic soft tissue implant within the lower mid mediastinum, periesophageal, measuring 1.6 cm short axis dimension (series 3, image 9). Reproductive: Uterus and bilateral adnexa are unremarkable. Other: Scattered nodular implants throughout the anterior mesentery, highly suggestive of metastatic implants. Musculoskeletal: Degenerative spondylosis throughout the thoracolumbar spine, mild to moderate in degree. No acute or suspicious osseous finding. IMPRESSION: 1. Rounded masslike consolidation at the right lung base, measuring 3.1 cm, highly suspicious for pulmonary  metastasis, much less likely rounded pneumonia. Associated pleural thickening and/or small pleural effusion at the right lung base, possibly additional malignant metastasis. 2. Numerous hypodense masses throughout the liver, largest within the anterior superior right liver lobe measuring 4.8 cm. These are consistent with metastatic disease. 3. Pancreatic tail mass, measuring 6.2 x 4.5 cm, almost certainly representing the primary neoplastic  mass. 4. Wedge-shaped peripheral hypodense lesion within the spleen, measuring 4 cm greatest dimension, suspected splenic infarct. Suspect associated occlusion/obstruction of the splenic vein by the aforementioned pancreatic mass. 5. Confluent lymphadenopathy at the porta hepatis, consistent with metastatic lymphadenopathy. 6. Scattered nodular implants throughout the anterior mesentery, consistent with metastatic implants. 7. Small perihepatic free fluid. These results were called by telephone at the time of interpretation on 08/29/2018 at 1:15 pm to Dr. Isla Pence , who verbally acknowledged these results. Electronically Signed   By: Franki Cabot M.D.   On: 08/29/2018 13:17   Dg Chest Portable 1 View  Result Date: 08/29/2018 CLINICAL DATA:  Rash over abdomen, hands and feet. EXAM: PORTABLE CHEST 1 VIEW COMPARISON:  08/10/2018 FINDINGS: Lungs are somewhat hypoinflated without focal airspace consolidation or effusion. Mild stable cardiomegaly. Remainder of the exam is unchanged. IMPRESSION: Hypoinflation without acute cardiopulmonary disease. Mild stable cardiomegaly. Electronically Signed   By: Marin Olp M.D.   On: 08/29/2018 11:20    Procedures Procedures (including critical care time)  Medications Ordered in ED Medications  sodium chloride 0.9 % bolus 1,000 mL (0 mLs Intravenous Stopped 08/29/18 1338)  sodium chloride 0.9 % bolus 1,000 mL (0 mLs Intravenous Stopped 08/29/18 1218)  iohexol (OMNIPAQUE) 300 MG/ML solution 75 mL (75 mLs Intravenous Contrast  Given 08/29/18 1230)     Initial Impression / Assessment and Plan / ED Course  I have reviewed the triage vital signs and the nursing notes.  Pertinent labs & imaging results that were available during my care of the patient were reviewed by me and considered in my medical decision making (see chart for details).      Pt's BP was low upon arrival.  That has improved with IVFs.  Pt has no fever or resp sx.  Lactic acid is elevated, but no fever or other signs of sepsis.  It is likely elevated because of dehydration.  Blood cultures sent and repeat lactic ordered.  No recent travel.    Unfortunately, pt's abd pain is from a pancreatic mass with mets to liver, lung.  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  ? Cause for the thrombocytopenia??    Type and screen ordered in case platelet transfusion was necessary.    Pt's daughter here and she was told of findings.  Pt d/w IMTS (unassigned) for admission.  CRITICAL CARE Performed by: Isla Pence   Total critical care time: 45 minutes  Critical care time was exclusive of separately billable procedures and treating other patients.  Critical care was necessary to treat or prevent imminent or life-threatening deterioration.  Critical care was time spent personally by me on the following activities: development of treatment plan with patient and/or surrogate as well as nursing, discussions with consultants, evaluation of patient's response to treatment, examination of patient, obtaining history from patient or surrogate, ordering and performing treatments and interventions, ordering and review of laboratory studies, ordering and review of radiographic studies, pulse oximetry and re-evaluation of patient's condition.   Final Clinical Impressions(s) / ED Diagnoses   Final diagnoses:  Thrombocytopenia ([place])  Primary pancreatic cancer with metastasis to other site [facility])  Hypotension, unspecified hypotension  type  Dehydration    ED Discharge Orders    None       Isla Pence, MD 08/29/18 1446

## 2018-08-29 NOTE — Consult Note (Signed)
PULMONARY / CRITICAL CARE MEDICINE   NAME:  Diana Todd, MRN:  [id number], DOB:  June 13, 1950, LOS: 0 ADMISSION DATE:  08/29/2018, CONSULTATION DATE:  08/29/2018 REFERRING MD:  Gilford Raid, CHIEF COMPLAINT:  Hypotension with petechial rash  BRIEF HISTORY:    This is a 32 y. W F who presented to the ED today with a rash.  She had recent strokes and was recently transferred to a SNF.  Pt had a stroke and new onset afib on 3/2.  She was d/c to home on 3/6.  Daughter felt like she could not get around by herself, so she brought her to the ED on 3/9.  She stayed as a boarder until 3/11 when a facility was found.  Per EMS, pt has had a rash for several days. In ED, plt ct noted to be 39K.  Patient underwent Abd CT that showed a pancreatic tail mass measures approximately 6.2 x 4.5 cm, numerous hypodense masses throughout the bilateral liver lobes, largest located within the anterior superior RIGHT liver lobe measuring 4.8 cm . Next largest mass is seen exophytic to the posterior inferior margin of the RIGHT liver lobe measuring 3.7 cm. There was also a wedge-shaped peripheral hypodense lesion within the spleen measures 4 cm greatest dimension, suspicious for a splenic infarct. Also present is a masslike consolidation at the RIGHT lung base, overlying the RIGHT hemidiaphragm and abutting the posterior pleural surface, measuring 3.1 cm.   PCCM was consulted due to soft BPs after fluid administration    SIGNIFICANT PAST MEDICAL HISTORY   Plt ct 280K, 09/18/2017; 121K 08/13/2018  STUDIES:   Abd CT 3/21 CULTURES:  BC x2: 3/21 Urine 3/21  ANTIBIOTICS:  None  LINES/TUBES:   Periph IV  SUBJECTIVE:  Patient w/o any specific c/o, pain, SOB  CONSTITUTIONAL: BP (!) 98/56   Pulse 85   Temp 97.8 F (36.6 C) (Oral)   Resp 20   Ht 5\' 3"  (1.6 m)   Wt 103.9 kg   SpO2 99%   BMI 40.57 kg/m   No intake/output data recorded.        PHYSICAL EXAM: General:  WD/WN W F NAD and mentating well Neuro:   A&O . No focal deficits.  HEENT:  [place]/AT, PERRLA, fundi benign; TMs clear; O-P unremarkable Cardiovascular:  RRR, No m/r/g Lungs:  Clear bilaterally Abdomen:  Supple without guarding Musculoskeletal:  No active joints Skin:  Petechial rah throughout with cyanosis (? Necrosis large toe)  RESOLVED PROBLEM LIST   ASSESSMENT AND PLAN   Abdominal CT highly suggestive of pancreatic neoplasm with mets. TCP likely secondary to malignancy. During my assessment, the patient had MAPs of 71 and was mentating well.  SUMMARY OF TODAY'S PLAN:  I believe the patient can be managed on a monitored room and ICU not currently warranted. Please call us if condition deteriorates.   LABS  Glucose No results for input(s): GLUCAP in the last 168 hours.  BMET Recent Labs  Lab 08/29/18 1034  NA 140  K 3.5  CL 105  CO2 18*  BUN 34*  CREATININE 1.42*  GLUCOSE 123*    Liver Enzymes Recent Labs  Lab 08/29/18 1034  AST 39  ALT 28  ALKPHOS 293*  BILITOT 1.6*  ALBUMIN 3.1*    Electrolytes Recent Labs  Lab 08/29/18 1034  CALCIUM 8.9    CBC Recent Labs  Lab 08/29/18 1034 08/29/18 1530  WBC 14.7* 11.6*  HGB 11.5* 10.0*  HCT 36.2 32.9*  PLT 46* 39*  40*    ABG No results for input(s): PHART, PCO2ART, PO2ART in the last 168 hours.  Coag's Recent Labs  Lab 08/29/18 1530  APTT PENDING  INR PENDING    Sepsis Markers Recent Labs  Lab 08/29/18 1035 08/29/18 1305  LATICACIDVEN 2.5* 2.2*    Cardiac Enzymes Recent Labs  Lab 08/29/18 1034  TROPONINI 0.09*    PAST MEDICAL HISTORY :   She  has a past medical history of Anxiety, DVT (deep venous thrombosis) ([place]), and Hypertension.  PAST SURGICAL HISTORY:  She  has a past surgical history that includes No past surgeries.  Allergies  Allergen Reactions  . Macrodantin [Nitrofurantoin Macrocrystal]     "welts" per pt.     No current facility-administered medications on file prior to encounter.    Current Outpatient  Medications on File Prior to Encounter  Medication Sig  . apixaban (ELIQUIS) 5 MG TABS tablet Take 5 mg by mouth 2 (two) times daily.  Marland Kitchen atenolol (TENORMIN) 50 MG tablet Take 50 mg by mouth daily.  . feeding supplement, ENSURE ENLIVE, (ENSURE ENLIVE) LIQD Take 237 mLs by mouth 3 (three) times daily between meals. (Patient not taking: Reported on 08/17/2018)  . lisinopril (PRINIVIL,ZESTRIL) 40 MG tablet Take 40 mg by mouth daily.   . pantoprazole (PROTONIX) 40 MG tablet Take 40 mg by mouth daily.  Marland Kitchen PARoxetine (PAXIL) 30 MG tablet Take 60 mg by mouth daily.  . traZODone (DESYREL) 50 MG tablet Take 50 mg by mouth at bedtime.    FAMILY HISTORY:   Her family history includes AAA (abdominal aortic aneurysm) in her father; Breast cancer in her mother.  SOCIAL HISTORY:  She  reports that she has never smoked. She has never used smokeless tobacco. She reports that she does not drink alcohol or use drugs.  REVIEW OF SYSTEMS:    Gen: No systemic c/o, F/C HEENT: No head trauma, visual/auditory disturbances; dysphagia Resp: Ho hx Cough, sputum, hemoptysis, exertional dyspnea CV: No hx palpitations, angina , MI. Positive hx HTN GI: No hx hematemesis, PUD, melena, hematechezia GU: No dysuria, hematuria, stones M-S: No hx arthritis.  Neuro: No hx seizures, syncope Endo: No thyroid disorders, DM

## 2018-08-29 NOTE — ED Notes (Signed)
Pt transported to CT ?

## 2018-08-29 NOTE — ED Notes (Signed)
Dr. Gilford Raid aware of 2.5 lactic acid; no further orders recieved

## 2018-08-29 NOTE — H&P (Signed)
Date: 08/29/2018               Patient Name:  Diana Todd MRN: [id number]  DOB: [birth year] Age / Sex: 68 y.o., female   PCP: System, Provider Not In         Medical Service: Internal Medicine Teaching Service         Attending Physician: Dr. Rebeca Alert    First Contact: Dr. Annie Paras Pager: [phone]  Second Contact: Dr. Berline Lopes Pager: [phone]       After Hours (After 5p/  First Contact Pager: [phone]  weekends / holidays): Second Contact Pager: [phone]   Chief Complaint: bilateral hand and feet rash  History of Present Illness: Diana Todd is a 68 yo female with a medical history of HTN, afib and prior DVT on Eliquis, CVA, systolic heart failure (EF 30-35% on 08/12/2018), who presented to the ED from her SNF with weakness and rash. She was recently admitted to [facility] for acute metabolic encephalopathy and discharged on 08/14/2018. The encephalopathy was thought to be multifactorial in the setting of electrolyte abnormalities and numerous acute/subacute infarcts in the brain and cerebellum. She had no neurological deficits. TEE during this admission showed a reduced EF of 30-35%. She was also given a new diagnosis of afib during this admission. She was discharged home, but her daughter reports she was weak and unable to take care of herself independently. She presented to the ED on 3/9 for RUQ abdominal pain and lower extremity edema. She remained in the ED until 3/11 when she was discharged from the ED to SNF. Her daughter has not seen her since she was admitted to the SNF ten days ago due to the [place] visitation restrictions. Today, SNF caregivers noted a rash on her bilateral hands and feet. She was brought to the ED by EMS.   The daughter reports that prior to the initial hospitalization, the patient has been living independently. However, she now seems more confused and weak to the daughter. The patient endorses lack of appetite with poor PO intake for over a month and a 50lb weight  loss in the past 6 months. She denies fevers, night sweats, nausea, vomiting, abdominal pain, hematochezia, melena, chest pain, dyspnea.   Upon arrival to the ED, the patient was afebrile and hypotensive to 73/38. She received 3L of fluids with improvement of her pressures to the 22G systolic. Labs significant for leukocytosis to 11/6, Hb 10, platelets 40, bicarb 18, BUN 34, Cr 1.42 (baseline <1), alk phos 293, total bili 1.6, anion gap 17, lactate 2.5 --> 2.2, troponin 0.09. UA unremarkable. EKG shows afib with deep T wave inversions in avl and v2. CXR without edema or opacities. CT head without acute changes. CT abdomen showed a large pancreatic mass, multiple liver lesions, a 3.1cm pulmonary nodule, and a splenic infarct consistent with metastatic pancreatic cancer.   Meds:   No current facility-administered medications on file prior to encounter.    Current Outpatient Medications on File Prior to Encounter  Medication Sig Dispense Refill   apixaban (ELIQUIS) 5 MG TABS tablet Take 5 mg by mouth 2 (two) times daily.     atenolol (TENORMIN) 50 MG tablet Take 50 mg by mouth daily.     feeding supplement, ENSURE ENLIVE, (ENSURE ENLIVE) LIQD Take 237 mLs by mouth 3 (three) times daily between meals. (Patient not taking: Reported on 08/17/2018) 237 mL 12   lisinopril (PRINIVIL,ZESTRIL) 40 MG tablet Take 40 mg by mouth daily.  pantoprazole (PROTONIX) 40 MG tablet Take 40 mg by mouth daily.     PARoxetine (PAXIL) 30 MG tablet Take 60 mg by mouth daily.     traZODone (DESYREL) 50 MG tablet Take 50 mg by mouth at bedtime.      Allergies: Allergies as of 08/29/2018 - Review Complete 08/29/2018  Allergen Reaction Noted   Macrodantin [nitrofurantoin macrocrystal]  08/13/2018   Past Medical History:  Diagnosis Date   Anxiety    DVT (deep venous thrombosis) (HCC)    Hypertension    Past Surgical History:  Procedure Laterality Date   NO PAST SURGERIES      Family History:  Family  History  Problem Relation Age of Onset   Breast cancer Mother    AAA (abdominal aortic aneurysm) Father    Social History: Was living independently prior to March hospitalization. Never smoker. Denies etoh or illicit drug use.  Review of Systems: A complete ROS was negative except as per HPI.   Physical Exam: Blood pressure 109/62, pulse (!) 103, temperature 97.8 F (36.6 C), temperature source Oral, resp. rate 18, height 5' 3" (1.6 m), weight 103 kg, SpO2 96 %.  Constitutional: Lying in trendelenburg, alert and oriented x3, pallor present, no distress Eyes: Pupils are equal, round, and reactive to light. EOM are normal.  Cardiovascular: Irregularly irregular. No murmurs. Pulmonary/Chest: Effort normal. Clear to auscultation bilaterally. No wheezes, rales, or rhonchi. Abdominal: Bowel sounds present. Soft, non-distended, suprapubic tenderness (pt attributes to full bladder) Ext: 1+ pitting edema in the BLE Skin: Warm and dry. Palpable, non-blanching, petechial eruption of the bilateral dorsal hands and feet. The right big toe has a large violaceous bulla. There is a more confluent region of palpable purpura on the patient's LLQ. She also has deep purple ecchymoses where her blood pressure cuff and IVs are located.  EKG: personally reviewed my interpretation is afib with deep t wave inversions in avl and v2, which are much deeper than previous.  CXR: personally reviewed my interpretation is no edema or opacities.  CT Abdomen Pelvis 1. Rounded masslike consolidation at the right lung base, measuring 3.1 cm, highly suspicious for pulmonary metastasis, much less likely rounded pneumonia. Associated pleural thickening and/or small pleural effusion at the right lung base, possibly additional malignant metastasis. 2. Numerous hypodense masses throughout the liver, largest within the anterior superior right liver lobe measuring 4.8 cm. These are consistent with metastatic disease. 3.  Pancreatic tail mass, measuring 6.2 x 4.5 cm, almost certainly representing the primary neoplastic mass. 4. Wedge-shaped peripheral hypodense lesion within the spleen, measuring 4 cm greatest dimension, suspected splenic infarct. Suspect associated occlusion/obstruction of the splenic vein by the aforementioned pancreatic mass. 5. Confluent lymphadenopathy at the porta hepatis, consistent with metastatic lymphadenopathy. 6. Scattered nodular implants throughout the anterior mesentery, consistent with metastatic implants. 7. Small perihepatic free fluid.  Assessment & Plan by Problem: Active Problems:   Pancreatic mass  Ms. Claassen is a 68 yo female with a medical history of HTN, afib and prior DVT on Eliquis, CVA, systolic heart failure (EF 30-35% on 08/12/2018), who presented to the ED from her SNF with weakness and rash. She was hypotensive on arrival, with modest improvement in her blood pressure to the 15Q systolic after 3L NS. She has petechia and palpable purpura with a platelet count of 40 and stable hemoglobin. CT abdomen pelvis showed probable metastatic pancreatic cancer.  Hypotension Palpable purpura Thrombocytopenia - Her clinical presentation is suspicious for a coagulopathy including DIC and TTP  in the setting of malignancy. Infection is also possible, however the patient has no signs or symptoms of infection other than a mildly elevated white count (which is decreased from prior labs). If this is DIC in the setting of malignancy, there are not many options for treating the underlying disease in the short term. - The patient is currently maintaining MAPs > 65 after fluid resuscitation and Trendelenburg position. However, given her low EF and pitting edema in her lower legs, she may not tolerate more aggressive fluid administration. Not currently endorsing dyspnea. - Discussed the guarded prognosis with the patient's daughter who is in disbelief that her mother has deteriorated so  much in the past few weeks. She states the patient is full code and would like to pursue aggressive interventions. Plan - Consult PCCM for blood pressure support, appreciate recs - Tele - DIC panel, LDH, haptoglobin  - Trend CBC - F/u blood cultures and urine culture - Hold home eliquis  Pancreatic mass - Likely primary pancreatic malignancy with mets to the liver, lung, and lymph nodes. ALP and total bili are elevated, but LFTs are wnl.  - This is likely the cause of the patient's anorexia, weight loss, and previously reported RUQ.  - Given the widespread metastasis of this malignancy, her prognosis is very poor.  Plan - Consult oncology tomorrow   Anion gap metabolic acidosis - Bicarb 18. Anion gap 17. Lactate 2.5. Likely lactic acidosis in setting of hypotension and possible coagulopathy. May also be starvation ketosis given patient has had poor PO intake, although no ketones in the UA.  Plan - Trend BMP and lactate   AKI - Cr elevated to 1.42. Basline < 1. Likely prerenal in the setting of dehydration. Plan - IVF  - Trend Cr  Elevated troponin  - Troponin elevated to 0.09. EKG shows very deep t wave inversions in avl and v2, much deeper compared to prior. Patient denies chest pain. - May be demand ischemia in the setting of hypotension and possible coagulopathy. Plan - Trend troponin  - Repeat EKG in the am  FEN: NS with K at 22m/hr, NPO diet, replace electrolytes as needed  DVT ppx: holding in the setting of thrombocytopenia and possible DIC Code status: FULL code  Dispo: Admit patient to Inpatient with expected length of stay greater than 2 midnights.  Signed: DCorinne Ports MD 08/29/2018, 5:58 PM  Pager: [phone]

## 2018-08-29 NOTE — ED Triage Notes (Signed)
Per [facility]- pt picked up from [facility] center due to purplish rash noted to abd, bilateral hand and feet.  Pt denies pain. States she recently started on coumadin.

## 2018-08-29 NOTE — ED Notes (Signed)
ED TO INPATIENT HANDOFF REPORT  ED Nurse Name and Phone #: Lupe Handley [phone]  S Name/Age/Gender Diana Todd 68 y.o. female Room/Bed: 041C/041C  Code Status   Code Status: Prior  Home/SNF/Other Rehab Patient oriented to: self, place, time and situation Is this baseline? Yes   Triage Complete: Triage complete  Chief Complaint rash  Triage Note Per [facility]- pt picked up from [facility] center due to purplish rash noted to abd, bilateral hand and feet.  Pt denies pain. States she recently started on coumadin.    Allergies Allergies  Allergen Reactions  . Macrodantin [Nitrofurantoin Macrocrystal]     "welts" per pt.     Level of Care/Admitting Diagnosis ED Disposition    ED Disposition Condition [facility] Area: [place] [100100]  Level of Care: Progressive [102]  Diagnosis: Pancreatic mass [854627]  Admitting Physician: Oda Kilts [[id number]]  Attending Physician: Oda Kilts [[id number]]  Estimated length of stay: past midnight tomorrow  Certification:: I certify this patient will need inpatient services for at least 2 midnights  PT Class (Do Not Modify): Inpatient [101]  PT Acc Code (Do Not Modify): Private [1]       B Medical/Surgery History Past Medical History:  Diagnosis Date  . Anxiety   . DVT (deep venous thrombosis) ([place])   . Hypertension    Past Surgical History:  Procedure Laterality Date  . NO PAST SURGERIES       A IV Location/Drains/Wounds Patient Lines/Drains/Airways Status   Active Line/Drains/Airways    Name:   Placement date:   Placement time:   Site:   Days:   Peripheral IV 08/29/18 Right Antecubital   08/29/18    1012    Antecubital   less than 1          Intake/Output Last 24 hours  Intake/Output Summary (Last 24 hours) at 08/29/2018 1523 Last data filed at 08/29/2018 1338 Gross per 24 hour  Intake 3158.87 ml  Output -  Net 3158.87 ml    Labs/Imaging Results for  orders placed or performed during the hospital encounter of 08/29/18 (from the past 48 hour(s))  Type and screen [place]     Status: None   Collection Time: 08/29/18 10:30 AM  Result Value Ref Range   ABO/RH(D) A NEG    Antibody Screen NEG    Sample Expiration      09/01/2018 Performed at [facility], [place] [address] [place], [place] [address]   ABO/Rh     Status: None (Preliminary result)   Collection Time: 08/29/18 10:30 AM  Result Value Ref Range   ABO/RH(D)      A NEG Performed at [facility] [address], [place], [place] [address]   Comprehensive metabolic panel     Status: Abnormal   Collection Time: 08/29/18 10:34 AM  Result Value Ref Range   Sodium 140 135 - 145 mmol/L   Potassium 3.5 3.5 - 5.1 mmol/L   Chloride 105 98 - 111 mmol/L   CO2 18 (L) 22 - 32 mmol/L   Glucose, Bld 123 (H) 70 - 99 mg/dL   BUN 34 (H) 8 - 23 mg/dL   Creatinine, Ser 1.42 (H) 0.44 - 1.00 mg/dL   Calcium 8.9 8.9 - 10.3 mg/dL   Total Protein 6.3 (L) 6.5 - 8.1 g/dL   Albumin 3.1 (L) 3.5 - 5.0 g/dL   AST 39 15 - 41 U/L  ALT 28 0 - 44 U/L   Alkaline Phosphatase 293 (H) 38 - 126 U/L   Total Bilirubin 1.6 (H) 0.3 - 1.2 mg/dL   GFR calc non Af Amer 38 (L) >60 mL/min   GFR calc Af Amer 44 (L) >60 mL/min   Anion gap 17 (H) 5 - 15    Comment: Performed at [facility] [address], [place], [place] [address]  CBC with Differential     Status: Abnormal   Collection Time: 08/29/18 10:34 AM  Result Value Ref Range   WBC 14.7 (H) 4.0 - 10.5 K/uL   RBC 3.86 (L) 3.87 - 5.11 MIL/uL   Hemoglobin 11.5 (L) 12.0 - 15.0 g/dL   HCT 36.2 36.0 - 46.0 %   MCV 93.8 80.0 - 100.0 fL   MCH 29.8 26.0 - 34.0 pg   MCHC 31.8 30.0 - 36.0 g/dL   RDW 19.7 (H) 11.5 - 15.5 %   Platelets 46 (L) 150 - 400 K/uL    Comment: REPEATED TO VERIFY PLATELET COUNT CONFIRMED BY SMEAR SPECIMEN CHECKED FOR CLOTS Immature Platelet Fraction may be clinically indicated, consider ordering  this additional test [id number]    nRBC 0.0 0.0 - 0.2 %   Neutrophils Relative % 85 %   Neutro Abs 12.6 (H) 1.7 - 7.7 K/uL   Lymphocytes Relative 8 %   Lymphs Abs 1.2 0.7 - 4.0 K/uL   Monocytes Relative 6 %   Monocytes Absolute 0.8 0.1 - 1.0 K/uL   Eosinophils Relative 0 %   Eosinophils Absolute 0.0 0.0 - 0.5 K/uL   Basophils Relative 0 %   Basophils Absolute 0.0 0.0 - 0.1 K/uL   Immature Granulocytes 1 %   Abs Immature Granulocytes 0.07 0.00 - 0.07 K/uL    Comment: Performed at [facility] [address] [place], [place] [address]  Troponin I - Once     Status: Abnormal   Collection Time: 08/29/18 10:34 AM  Result Value Ref Range   Troponin I 0.09 (HH) <0.03 ng/mL    Comment: CRITICAL RESULT CALLED TO, READ BACK BY AND VERIFIED WITH: Laurin Coder, A RN @ 2595 ON 08/29/2018 BY TEMOCHE, H Performed at [facility], [place] [address], [place], [place] [address]   Lactic acid, plasma     Status: Abnormal   Collection Time: 08/29/18 10:35 AM  Result Value Ref Range   Lactic Acid, Venous 2.5 (HH) 0.5 - 1.9 mmol/L    Comment: CRITICAL RESULT CALLED TO, READ BACK BY AND VERIFIED WITH: Laurin Coder, A RN @ 1125 ON 08/29/2018 BY TEMOCHE, H Performed at [facility], [place] [address], [place], [place] [address]   Lactic acid, plasma     Status: Abnormal   Collection Time: 08/29/18  1:05 PM  Result Value Ref Range   Lactic Acid, Venous 2.2 (HH) 0.5 - 1.9 mmol/L    Comment: CRITICAL RESULT CALLED TO, READ BACK BY AND VERIFIED WITH: Laurin Coder, A RN @ 9518 ON 08/29/2018 BY TEMOCHE, H Performed at [facility], [place] [address], [place], [place] [address]   Immature Platelet Fraction     Status: None   Collection Time: 08/29/18  1:05 PM  Result Value Ref Range   Immature Platelet Fraction 8.1 1.2 - 8.6 %    Comment: Performed at [facility], [place] [address], [place], [place] [address]  Urinalysis, Routine w reflex microscopic     Status: None   Collection Time: 08/29/18  1:50 PM  Result Value Ref Range   Color, Urine YELLOW YELLOW   APPearance CLEAR CLEAR   Specific Gravity, Urine 1.015 1.005 - 1.030   pH 5.0 5.0 - 8.0   Glucose, UA NEGATIVE NEGATIVE mg/dL   Hgb urine dipstick NEGATIVE NEGATIVE   Bilirubin Urine NEGATIVE NEGATIVE   Ketones, ur NEGATIVE NEGATIVE mg/dL   Protein, ur NEGATIVE NEGATIVE mg/dL   Nitrite NEGATIVE NEGATIVE   Leukocytes,Ua NEGATIVE NEGATIVE    Comment: Performed at [facility] [address], [place], [place] [address]   Ct Head Wo Contrast  Result Date: 08/29/2018 CLINICAL DATA:  Altered mental status. Recently started on Coumadin. EXAM: CT HEAD WITHOUT CONTRAST TECHNIQUE: Contiguous axial images were obtained from the base of the skull through the vertex without intravenous contrast. COMPARISON:  Head CT dated 08/10/2018. Brain MRI dated 08/11/2018. FINDINGS: Brain: Mild generalized parenchymal volume loss with commensurate dilatation of the ventricles and sulci. No hydrocephalus. Mild chronic small vessel ischemic changes within the bilateral periventricular white matter regions. No mass, hemorrhage, edema or other evidence of acute parenchymal abnormality. No extra-axial hemorrhage. Vascular: Chronic calcified atherosclerotic changes of the large vessels at the skull base. No unexpected hyperdense vessel. Skull: Normal. Negative for fracture or focal lesion. Sinuses/Orbits: Mucosal thickening within the LEFT maxillary sinus, of uncertain age. Paranasal sinuses otherwise clear. Periorbital and retro-orbital soft tissues are unremarkable. Other: None. IMPRESSION: 1. No acute findings. No intracranial mass, hemorrhage or edema. 2. Mild atrophy and chronic small vessel ischemic changes in the white matter. Electronically Signed   By: Franki Cabot M.D.   On: 08/29/2018 13:03   Ct Abdomen Pelvis W Contrast  Result Date: 08/29/2018 CLINICAL DATA:  Generalized abdominal pain beginning today. Recently started on Coumadin. EXAM: CT  ABDOMEN AND PELVIS WITH CONTRAST TECHNIQUE: Multidetector CT imaging of the abdomen and pelvis was performed using the standard protocol following bolus administration of intravenous contrast. CONTRAST:  69mL OMNIPAQUE IOHEXOL 300 MG/ML  SOLN COMPARISON:  None. FINDINGS: Lower chest: Masslike consolidation at the RIGHT lung base, overlying the RIGHT hemidiaphragm and abutting the posterior pleural surface, measuring 3.1 cm (series 5, image 35). Pleural thickening and/or small pleural effusion at the RIGHT lung base. Hepatobiliary: Numerous hypodense masses throughout the bilateral liver lobes, largest located within the anterior superior RIGHT liver lobe measuring 4.8 cm (series 3, image 17). Next largest mass is seen exophytic to the posterior inferior margin of the RIGHT liver lobe measuring 3.7 cm (series 3, image 42). Small perihepatic free fluid. Gallbladder is unremarkable. No bile duct dilatation seen. Pancreas: Pancreatic tail mass measures approximately 6.2 x 4.5 cm (series 3, image 21). Spleen: Wedge-shaped peripheral hypodense lesion within the spleen measures 4 cm greatest dimension, suspected splenic infarct. Adrenals/Urinary Tract: Adrenal glands are unremarkable. Small hypodense masses within the RIGHT kidney, too small to characterize. No hydronephrosis bilaterally. Bladder is unremarkable. Stomach/Bowel: No dilated large or small bowel loops. Stomach is unremarkable, partially decompressed. Vascular/Lymphatic: Suspected obstruction/occlusion of the splenic vein. No other acute or significant vascular abnormality identified. Confluent lymphadenopathy at the porta hepatis, likely metastatic. Additional adenopathy versus metastatic soft tissue implant within the lower mid mediastinum, periesophageal, measuring 1.6 cm short axis dimension (series 3, image 9). Reproductive: Uterus and bilateral adnexa are unremarkable. Other: Scattered nodular implants throughout the anterior mesentery, highly suggestive  of metastatic implants. Musculoskeletal: Degenerative spondylosis throughout the thoracolumbar spine, mild to moderate in degree. No acute or suspicious osseous finding. IMPRESSION: 1. Rounded masslike consolidation at the right  lung base, measuring 3.1 cm, highly suspicious for pulmonary metastasis, much less likely rounded pneumonia. Associated pleural thickening and/or small pleural effusion at the right lung base, possibly additional malignant metastasis. 2. Numerous hypodense masses throughout the liver, largest within the anterior superior right liver lobe measuring 4.8 cm. These are consistent with metastatic disease. 3. Pancreatic tail mass, measuring 6.2 x 4.5 cm, almost certainly representing the primary neoplastic mass. 4. Wedge-shaped peripheral hypodense lesion within the spleen, measuring 4 cm greatest dimension, suspected splenic infarct. Suspect associated occlusion/obstruction of the splenic vein by the aforementioned pancreatic mass. 5. Confluent lymphadenopathy at the porta hepatis, consistent with metastatic lymphadenopathy. 6. Scattered nodular implants throughout the anterior mesentery, consistent with metastatic implants. 7. Small perihepatic free fluid. These results were called by telephone at the time of interpretation on 08/29/2018 at 1:15 pm to Dr. Isla Pence , who verbally acknowledged these results. Electronically Signed   By: Franki Cabot M.D.   On: 08/29/2018 13:17   Dg Chest Portable 1 View  Result Date: 08/29/2018 CLINICAL DATA:  Rash over abdomen, hands and feet. EXAM: PORTABLE CHEST 1 VIEW COMPARISON:  08/10/2018 FINDINGS: Lungs are somewhat hypoinflated without focal airspace consolidation or effusion. Mild stable cardiomegaly. Remainder of the exam is unchanged. IMPRESSION: Hypoinflation without acute cardiopulmonary disease. Mild stable cardiomegaly. Electronically Signed   By: Marin Olp M.D.   On: 08/29/2018 11:20    Pending Labs Unresulted Labs (From  admission, onward)    Start     Ordered   08/29/18 1513  CBC  ONCE - STAT,   R     08/29/18 1512   08/29/18 1512  DIC (disseminated intravasc coag) panel  ONCE - STAT,   R     08/29/18 1511   08/29/18 1205  Urine culture  ONCE - STAT,   STAT     08/29/18 1204   08/29/18 1000  Culture, blood (routine x 2)  BLOOD CULTURE X 2,   STAT     08/29/18 0959          Vitals/Pain Today's Vitals   08/29/18 1145 08/29/18 1230 08/29/18 1345 08/29/18 1400  BP: (!) 82/50 (!) 93/56 92/68 (!) 95/55  Pulse: 94 94 91 92  Resp: 13 18 (!) 21 14  Temp:      TempSrc:      SpO2: 100% 100% 100% 100%  Weight:      Height:      PainSc:        Isolation Precautions No active isolations  Medications Medications  sodium chloride 0.9 % bolus 1,000 mL (0 mLs Intravenous Stopped 08/29/18 1338)  sodium chloride 0.9 % bolus 1,000 mL (0 mLs Intravenous Stopped 08/29/18 1218)  iohexol (OMNIPAQUE) 300 MG/ML solution 75 mL (75 mLs Intravenous Contrast Given 08/29/18 1230)  sodium chloride 0.9 % bolus 1,000 mL (1,000 mLs Intravenous New Bag/Given 08/29/18 1517)    Mobility non-ambulatory High fall risk   Focused Assessments Cardiac Assessment Handoff:  Cardiac Rhythm: Atrial fibrillation Lab Results  Component Value Date   TROPONINI 0.09 (HH) 08/29/2018   No results found for: DDIMER Does the Patient currently have chest pain? No     R Recommendations: See Admitting Provider Note  Report given to:   Additional Notes:

## 2018-08-29 NOTE — ED Notes (Addendum)
Dr. Gilford Raid aware of 0.09 troponin; no further orders recieved

## 2018-08-30 DIAGNOSIS — C259 Malignant neoplasm of pancreas, unspecified: Secondary | ICD-10-CM

## 2018-08-30 DIAGNOSIS — C787 Secondary malignant neoplasm of liver and intrahepatic bile duct: Secondary | ICD-10-CM

## 2018-08-30 DIAGNOSIS — K8689 Other specified diseases of pancreas: Secondary | ICD-10-CM

## 2018-08-30 DIAGNOSIS — C7800 Secondary malignant neoplasm of unspecified lung: Secondary | ICD-10-CM

## 2018-08-30 DIAGNOSIS — Z515 Encounter for palliative care: Secondary | ICD-10-CM

## 2018-08-30 DIAGNOSIS — I96 Gangrene, not elsewhere classified: Secondary | ICD-10-CM

## 2018-08-30 DIAGNOSIS — I429 Cardiomyopathy, unspecified: Secondary | ICD-10-CM

## 2018-08-30 DIAGNOSIS — C779 Secondary and unspecified malignant neoplasm of lymph node, unspecified: Secondary | ICD-10-CM

## 2018-08-30 DIAGNOSIS — Z66 Do not resuscitate: Secondary | ICD-10-CM

## 2018-08-30 DIAGNOSIS — G9340 Encephalopathy, unspecified: Secondary | ICD-10-CM

## 2018-08-30 DIAGNOSIS — D65 Disseminated intravascular coagulation [defibrination syndrome]: Secondary | ICD-10-CM

## 2018-08-30 DIAGNOSIS — Z7189 Other specified counseling: Secondary | ICD-10-CM

## 2018-08-30 LAB — COMPREHENSIVE METABOLIC PANEL
ALT: 22 U/L (ref 0–44)
AST: 35 U/L (ref 15–41)
Albumin: 2.5 g/dL — ABNORMAL LOW (ref 3.5–5.0)
Alkaline Phosphatase: 249 U/L — ABNORMAL HIGH (ref 38–126)
Anion gap: 15 (ref 5–15)
BUN: 30 mg/dL — ABNORMAL HIGH (ref 8–23)
CO2: 19 mmol/L — ABNORMAL LOW (ref 22–32)
Calcium: 8.2 mg/dL — ABNORMAL LOW (ref 8.9–10.3)
Chloride: 108 mmol/L (ref 98–111)
Creatinine, Ser: 1.34 mg/dL — ABNORMAL HIGH (ref 0.44–1.00)
GFR calc Af Amer: 47 mL/min — ABNORMAL LOW (ref >60)
GFR calc non Af Amer: 41 mL/min — ABNORMAL LOW (ref >60)
Glucose, Bld: 88 mg/dL (ref 70–99)
Potassium: 3.3 mmol/L — ABNORMAL LOW (ref 3.5–5.1)
Sodium: 142 mmol/L (ref 135–145)
Total Bilirubin: 1.3 mg/dL — ABNORMAL HIGH (ref 0.3–1.2)
Total Protein: 5.3 g/dL — ABNORMAL LOW (ref 6.5–8.1)

## 2018-08-30 LAB — URINE CULTURE: Culture: NO GROWTH

## 2018-08-30 LAB — PREPARE CRYOPRECIPITATE
Unit division: 0
Unit division: 0

## 2018-08-30 LAB — BPAM CRYOPRECIPITATE
Blood Product Expiration Date: 202003220155
Blood Product Expiration Date: 202003220155
ISSUE DATE / TIME: 202003212133
ISSUE DATE / TIME: 202003212308
Unit Type and Rh: 6200
Unit Type and Rh: 6200

## 2018-08-30 LAB — CBC
HCT: 31.9 % — ABNORMAL LOW (ref 36.0–46.0)
Hemoglobin: 9.7 g/dL — ABNORMAL LOW (ref 12.0–15.0)
MCH: 28.4 pg (ref 26.0–34.0)
MCHC: 30.4 g/dL (ref 30.0–36.0)
MCV: 93.3 fL (ref 80.0–100.0)
Platelets: 40 10*3/uL — ABNORMAL LOW (ref 150–400)
RBC: 3.42 MIL/uL — ABNORMAL LOW (ref 3.87–5.11)
RDW: 19.7 % — ABNORMAL HIGH (ref 11.5–15.5)
WBC: 11.8 10*3/uL — ABNORMAL HIGH (ref 4.0–10.5)
nRBC: 0.2 % (ref 0.0–0.2)

## 2018-08-30 LAB — TROPONIN I
Troponin I: 0.09 ng/mL (ref <0.03)
Troponin I: 0.09 ng/mL (ref <0.03)

## 2018-08-30 LAB — HAPTOGLOBIN: Haptoglobin: <10 mg/dL — ABNORMAL LOW (ref 37–355)

## 2018-08-30 LAB — MRSA PCR SCREENING: MRSA by PCR: POSITIVE — AB

## 2018-08-30 MED ORDER — GLYCOPYRROLATE 0.2 MG/ML IJ SOLN: 0.2000 mg | INTRAMUSCULAR | Status: DC | PRN

## 2018-08-30 MED ORDER — PHYTONADIONE 5 MG PO TABS
5.0000 mg | ORAL_TABLET | Freq: Every day | ORAL | Status: DC
  Filled 2018-08-30: qty 1

## 2018-08-30 MED ORDER — ONDANSETRON 4 MG PO TBDP: 4.0000 mg | ORAL_TABLET | Freq: Four times a day (QID) | ORAL | 0 refills | Status: AC | PRN

## 2018-08-30 MED ORDER — ONDANSETRON 4 MG PO TBDP
4.0000 mg | ORAL_TABLET | Freq: Four times a day (QID) | ORAL | Status: DC | PRN
  Filled 2018-08-30: qty 1

## 2018-08-30 MED ORDER — BIOTENE DRY MOUTH MT LIQD: 15.0000 mL | OROMUCOSAL | Status: DC | PRN

## 2018-08-30 MED ORDER — MORPHINE SULFATE (CONCENTRATE) 10 MG/0.5ML PO SOLN: 5.0000 mg | ORAL | 0 refills | Status: AC | PRN

## 2018-08-30 MED ORDER — ACETAMINOPHEN 325 MG PO TABS: 650.0000 mg | ORAL_TABLET | Freq: Four times a day (QID) | ORAL | 0 refills | Status: AC | PRN

## 2018-08-30 MED ORDER — ACETAMINOPHEN 325 MG PO TABS: 650.0000 mg | ORAL_TABLET | Freq: Four times a day (QID) | ORAL | Status: DC | PRN

## 2018-08-30 MED ORDER — MORPHINE SULFATE (CONCENTRATE) 10 MG/0.5ML PO SOLN
5.0000 mg | ORAL | Status: DC | PRN
  Administered 2018-08-30: 5 mg via SUBLINGUAL

## 2018-08-30 MED ORDER — MUPIROCIN 2 % EX OINT
1.0000 "application " | TOPICAL_OINTMENT | Freq: Two times a day (BID) | CUTANEOUS | Status: DC
  Administered 2018-08-30: 1 via NASAL
  Filled 2018-08-30: qty 22

## 2018-08-30 MED ORDER — ACETAMINOPHEN 325 MG PO TABS
650.0000 mg | ORAL_TABLET | Freq: Four times a day (QID) | ORAL | Status: AC | PRN
  Administered 2018-08-30: 650 mg via ORAL
  Filled 2018-08-30: qty 2

## 2018-08-30 MED ORDER — CHLORHEXIDINE GLUCONATE CLOTH 2 % EX PADS
6.0000 | MEDICATED_PAD | Freq: Every day | CUTANEOUS | Status: DC
  Administered 2018-08-30: 6 via TOPICAL

## 2018-08-30 MED ORDER — POLYVINYL ALCOHOL 1.4 % OP SOLN
1.0000 [drp] | Freq: Four times a day (QID) | OPHTHALMIC | Status: DC | PRN
  Filled 2018-08-30: qty 15

## 2018-08-30 MED ORDER — GLYCOPYRROLATE 1 MG PO TABS: 1.0000 mg | ORAL_TABLET | ORAL | Status: DC | PRN

## 2018-08-30 MED ORDER — MORPHINE SULFATE (CONCENTRATE) 10 MG/0.5ML PO SOLN
5.0000 mg | ORAL | Status: DC | PRN
  Filled 2018-08-30: qty 0.5

## 2018-08-30 MED ORDER — ACETAMINOPHEN 650 MG RE SUPP: 650.0000 mg | Freq: Four times a day (QID) | RECTAL | Status: DC | PRN

## 2018-08-30 MED ORDER — ONDANSETRON HCL 4 MG/2ML IJ SOLN: 4.0000 mg | Freq: Four times a day (QID) | INTRAMUSCULAR | Status: DC | PRN

## 2018-08-30 MED ORDER — GLYCOPYRROLATE 1 MG PO TABS: 1.0000 mg | ORAL_TABLET | ORAL | 0 refills | Status: AC | PRN

## 2018-08-30 NOTE — Consult Note (Signed)
                                                                                 Consultation Note Date: 08/30/2018   Patient Name: Diana Todd  DOB: 09/07/1950  MRN: [id number]  Age / Sex: 67 y.o., female  PCP: System, Provider Not In Referring Physician: Raines, Alexander N, MD  Reason for Consultation: Establishing goals of care  HPI/Patient Profile: 67 y.o. female  with past medical history of HTN, DVT, anxiety admitted on 08/29/2018 with weakness, confusion, rash, 50 lb weight loss over past 6 months, gradual decline over past months and especially past 1 month. Found to have recent DVT, multifocal CVA, atrial fibrillation, CHF, and likely metastatic inoperable pancreatic cancer and DIC. She has continued to decline over this hospitalization with continued confusion and little to no intake.   Clinical Assessment and Goals of Care: I met today with Ms. Lipkin's daughter, Amanda. Amanda is able to explain to me her mother's health condition and acknowledgement that this is not going to get better. She knows that her mother will die from these conditions. At this time she has spoken with her siblings and they have decided that they would rather have their mother at hospice so they can be with her in her last days. They understand that her prognosis is very poor and likely only days left. Amanda confirms DNR. We also discussed the change in interventions and focus of care that would be. All questions/concerns addressed. Emotional support provided.   Primary Decision Maker NEXT OF KIN    SUMMARY OF RECOMMENDATIONS   - Transition to hospice facility for comfort care  Code Status/Advance Care Planning:  DNR   Symptom Management:   Primary team to add comfort meds and minimize other medications to ensure comfort  Palliative Prophylaxis:   Aspiration, Delirium Protocol, Frequent Pain Assessment, Oral Care and Turn Reposition  Additional Recommendations (Limitations, Scope,  Preferences):  Full Comfort Care  Psycho-social/Spiritual:   Desire for further Chaplaincy support:yes  Additional Recommendations: Education on Hospice and Grief/Bereavement Support  Prognosis:   < 2 weeks  Discharge Planning: Hospice facility      Primary Diagnoses: Present on Admission: . Pancreatic mass . DIC (disseminated intravascular coagulation) (HCC) . Acute kidney injury (HCC) . Encephalopathy   I have reviewed the medical record, interviewed the patient and family, and examined the patient. The following aspects are pertinent.  Past Medical History:  Diagnosis Date  . Anxiety   . DVT (deep venous thrombosis) (HCC)   . Hypertension    Social History   Socioeconomic History  . Marital status: Unknown    Spouse name: Not on file  . Number of children: Not on file  . Years of education: Not on file  . Highest education level: Not on file  Occupational History  . Not on file  Social Needs  . Financial resource strain: Not on file  . Food insecurity:    Worry: Not on file    Inability: Not on file  . Transportation needs:    Medical: Not on file    Non-medical: Not on file  Tobacco Use  . Smoking status: Never   Smoker  . Smokeless tobacco: Never Used  Substance and Sexual Activity  . Alcohol use: Never    Frequency: Never  . Drug use: Never  . Sexual activity: Not on file  Lifestyle  . Physical activity:    Days per week: Not on file    Minutes per session: Not on file  . Stress: Not on file  Relationships  . Social connections:    Talks on phone: Not on file    Gets together: Not on file    Attends religious service: Not on file    Active member of club or organization: Not on file    Attends meetings of clubs or organizations: Not on file    Relationship status: Not on file  Other Topics Concern  . Not on file  Social History Narrative  . Not on file   Family History  Problem Relation Age of Onset  . Breast cancer Mother   . AAA  (abdominal aortic aneurysm) Father    Scheduled Meds: . Chlorhexidine Gluconate Cloth  6 each Topical Q0600  . mupirocin ointment  1 application Nasal BID   Continuous Infusions: . 0.9 % NaCl with KCl 20 mEq / L 50 mL/hr at 08/29/18 1848   PRN Meds:. Allergies  Allergen Reactions  . Macrodantin [Nitrofurantoin Macrocrystal] Hives   Review of Systems  Unable to perform ROS: Acuity of condition    Physical Exam Vitals signs and nursing note reviewed.  Constitutional:      General: She is sleeping.     Appearance: She is ill-appearing.  Cardiovascular:     Rate and Rhythm: Rhythm irregularly irregular.  Pulmonary:     Effort: Pulmonary effort is normal. No tachypnea, accessory muscle usage or respiratory distress.  Neurological:     Comments: Sleeping. With fluctuating mentation with periods of orientation per family and staff.      Vital Signs: BP 105/66 (BP Location: Left Arm)   Pulse (!) 108   Temp 97.9 F (36.6 C) (Axillary)   Resp (!) 23   Ht 5' 3" (1.6 m)   Wt 104.3 kg   SpO2 100%   BMI 40.74 kg/m  Pain Scale: 0-10   Pain Score: Asleep   SpO2: SpO2: 100 % O2 Device:SpO2: 100 % O2 Flow Rate: .O2 Flow Rate (L/min): 1 L/min  IO: Intake/output summary:   Intake/Output Summary (Last 24 hours) at 08/30/2018 1119 Last data filed at 08/30/2018 8921 Gross per 24 hour  Intake 5374.87 ml  Output -  Net 5374.87 ml    LBM: Last BM Date: 08/28/18 Baseline Weight: Weight: 103.9 kg Most recent weight: Weight: 104.3 kg     Palliative Assessment/Data:     Time Total: 50 min Greater than 50%  of this time was spent counseling and coordinating care related to the above assessment and plan.  Signed by: Vinie Sill, NP Palliative Medicine Team Pager # [phone] (M-F 8a-5p) Team Phone # [phone] (Nights/Weekends)

## 2018-08-30 NOTE — TOC Initial Note (Signed)
Transition of Care [facility]) - Initial/Assessment Note    Patient Details  Name: Diana Todd MRN: [id number] Date of Birth: 11/16/1950  Transition [facility]) CM/SW Contact:    Malon Kindle, LCSW Phone Number: [phone] 08/30/2018, 3:17 PM  Clinical Narrative:                 Clinical Social Worker received notification from MD that patient is now transitioning to full comfort and family would like to pursue hospice home placement.  CSW made referral to [facility], per family request.  Facility was able to contact family and have all necessary paperwork completed for admission today.  Expected Discharge Plan: Hospice Medical Facility([facility]) Barriers to Discharge: No Barriers Identified   Patient Goals and CMS Choice Patient states their goals for this hospitalization and ongoing recovery are:: Patient family would like for patient to experience comfort at the end of life CMS Medicare.gov Compare Post Acute Care list provided to:: Other (Comment Required)(No list needed) Choice offered to / list presented to : Adult Children  Expected Discharge Plan and Services Expected Discharge Plan: Hospice Medical Facility([facility]) In-house Referral: Clinical Social Work   Post Acute Care Choice: Hospice Living arrangements for the past 2 months: [place] Expected Discharge Date: 08/30/18                        Prior Living Arrangements/Services Living arrangements for the past 2 months: Single Family Home Lives with:: Facility Resident Patient language and need for interpreter reviewed:: Yes Do you feel safe going back to the place where you live?: Yes      Need for Family Participation in Patient Care: Yes (Comment) Care giver support system in place?: Yes (comment)   Criminal Activity/Legal Involvement Pertinent to Current Situation/Hospitalization: No - Comment as needed  Activities of Daily Living       Permission Sought/Granted Permission sought to share information with : Family Supports Permission granted to share information with : Yes, Verbal Permission Granted  Share Information with NAME: Francee Nodal  Permission granted to share info w AGENCY: [facility] granted to share info w Relationship: Daughter  Permission granted to share info w Contact Information: [phone]  Emotional Assessment Appearance:: Appears stated age Attitude/Demeanor/Rapport: Unable to Assess Affect (typically observed): Unable to Assess Orientation: : Oriented to Self Alcohol / Substance Use: Not Applicable Psych Involvement: No (comment)  Admission diagnosis:  Dehydration [E86.0] Thrombocytopenia (HCC) [D69.6] Hypotension, unspecified hypotension type [I95.9] Primary pancreatic cancer with metastasis to other site [facility]) [C25.9] Patient Active Problem List   Diagnosis Date Noted  . Pancreatic mass 08/29/2018  . DIC (disseminated intravascular coagulation) ([place]) 08/29/2018  . Acute kidney injury ([place]) 08/29/2018  . Encephalopathy 08/29/2018  . Moderate recurrent major depression ([place]) 08/14/2018  . Stroke ([place]) 08/14/2018  . Hyponatremia 08/10/2018   PCP:  System, Provider Not In Pharmacy:  No Pharmacies Listed    Social Determinants of Health (SDOH) Interventions    Readmission Risk Interventions No flowsheet data found.

## 2018-08-30 NOTE — Discharge Summary (Signed)
Name: Diana Todd MRN: [id number] DOB: 1951/04/08 68 y.o. PCP: System, Provider Not In  Date of Admission: 08/29/2018  9:49 AM Date of Discharge: 08/30/2018 Attending Physician: Dr. Rebeca Alert  Discharge Diagnosis: 1. Hypotension 2. DIC 3. Pancreatic mass   Discharge Medications: Allergies as of 08/30/2018      Reactions   Macrodantin [nitrofurantoin Macrocrystal] Hives      Medication List    STOP taking these medications   apixaban 5 MG Tabs tablet Commonly known as:  ELIQUIS   atenolol 50 MG tablet Commonly known as:  TENORMIN   donepezil 5 MG tablet Commonly known as:  ARICEPT   DULoxetine 30 MG capsule Commonly known as:  CYMBALTA   fluticasone 50 MCG/ACT nasal spray Commonly known as:  FLONASE   furosemide 40 MG tablet Commonly known as:  LASIX   hydrocortisone 2.5 % cream   lisinopril 40 MG tablet Commonly known as:  PRINIVIL,ZESTRIL   mirtazapine 7.5 MG tablet Commonly known as:  REMERON   pantoprazole 40 MG tablet Commonly known as:  PROTONIX   PARoxetine 20 MG tablet Commonly known as:  PAXIL   risperiDONE 0.5 MG tablet Commonly known as:  RISPERDAL   TUMS PO   Vitamin D (Ergocalciferol) 1.25 MG (50000 UT) Caps capsule Commonly known as:  DRISDOL     TAKE these medications   acetaminophen 325 MG tablet Commonly known as:  TYLENOL Take 2 tablets (650 mg total) by mouth every 6 (six) hours as needed for mild pain (or Fever >/= 101).   feeding supplement (ENSURE ENLIVE) Liqd Take 237 mLs by mouth 3 (three) times daily between meals. What changed:  Another medication with the same name was removed. Continue taking this medication, and follow the directions you see here.   glycopyrrolate 1 MG tablet Commonly known as:  ROBINUL Take 1 tablet (1 mg total) by mouth every 4 (four) hours as needed (excessive secretions).   morphine CONCENTRATE 10 MG/0.5ML Soln concentrated solution Take 0.25 mLs (5 mg total) by mouth every 2 (two) hours  as needed for moderate pain (or dyspnea).   ondansetron 4 MG disintegrating tablet Commonly known as:  ZOFRAN-ODT Take 1 tablet (4 mg total) by mouth every 6 (six) hours as needed for nausea.       Disposition and follow-up:   Diana Todd was discharged from [facility] in Critical condition.  At the hospital follow up visit please address:  1.  End of life care - Patient discharged to hospice facility - Please continue comfort measures - Comfort feeds, SL morphine prn, zofran prn, and glycopyrrolate prn  2.  Labs / imaging needed at time of follow-up: none  3.  Pending labs/ test needing follow-up: none  Follow-up Appointments: None   Hospital Course by problem list:  DIC secondary to pancreatic mass, likely metastatic malignancy: Diana Todd is a 68 yo female with a medical history of HTN,afib andpriorDVTon Eliquis, CVA, systolic heart failure (EF 30-35% on 08/12/2018), who presented to the EDfrom her SNF with weakness and rash. She was hypotensive on arrival, with only modest improvement in her blood pressures after 4L NS and maintenance fluids. Exam showed petechia and palpable purpura and somnolence. Labs significant for a platelet count of 40 and stable hemoglobin. CT abdomen pelvis showed probable metastatic pancreatic cancer with lesions in the liver, lungs, and lymph nodes. Coagulation profile was consistent with DIC and hepatic synthetic dysfunction. Her family met with internal medicine, oncology, and palliative care.  The family ultimately decided to make the patient comfort care and transition her to a hospice facility. All of her prior home medications, lab work, and vitals monitoring were discontinued.  Discharge Vitals:   BP 105/66 (BP Location: Left Arm)   Pulse (!) 108   Temp 97.9 F (36.6 C) (Axillary)   Resp (!) 23   Ht 5' 3" (1.6 m)   Wt 104.3 kg   SpO2 100%   BMI 40.74 kg/m   Pertinent Labs, Studies, and Procedures:  CBC Latest  Ref Rng & Units 08/30/2018 08/29/2018 08/29/2018  WBC 4.0 - 10.5 K/uL 11.8(H) 13.4(H) -  Hemoglobin 12.0 - 15.0 g/dL 9.7(L) 10.2(L) -  Hematocrit 36.0 - 46.0 % 31.9(L) 32.2(L) -  Platelets 150 - 400 K/uL 40(L) 39(L) 39(L)   CMP Latest Ref Rng & Units 08/30/2018 08/29/2018 08/17/2018  Glucose 70 - 99 mg/dL 88 123(H) 103(H)  BUN 8 - 23 mg/dL 30(H) 34(H) 19  Creatinine 0.44 - 1.00 mg/dL 1.34(H) 1.42(H) 0.74  Sodium 135 - 145 mmol/L 142 140 132(L)  Potassium 3.5 - 5.1 mmol/L 3.3(L) 3.5 4.4  Chloride 98 - 111 mmol/L 108 105 99  CO2 22 - 32 mmol/L 19(L) 18(L) 23  Calcium 8.9 - 10.3 mg/dL 8.2(L) 8.9 8.6(L)  Total Protein 6.5 - 8.1 g/dL 5.3(L) 6.3(L) 6.2(L)  Total Bilirubin 0.3 - 1.2 mg/dL 1.3(H) 1.6(H) 1.3(H)  Alkaline Phos 38 - 126 U/L 249(H) 293(H) 237(H)  AST 15 - 41 U/L 35 39 45(H)  ALT 0 - 44 U/L _0 Discharge Instructions: Discharge Instructions    Diet general   Complete by:  As directed       Signed: Corinne Ports, MD 08/30/2018, 12:18 PM   Pager: [phone]

## 2018-08-30 NOTE — Progress Notes (Signed)
Subjective: Patient experienced bilateral foot pain overnight for which she received tylenol. This morning, she is more somnolent than yesterday. She is alert and oriented, but she does not respond appropriately to all questions and is difficult to arouse. She denies pain. Her daughter is leaning towards hospice placement. She is concerned that her mother will die alone in the hospital because of the covid visitor restrictions, and would ideally like her to move to a hospice facility with visitation.   Objective:  Vital signs in last 24 hours: Vitals:   08/29/18 2314 08/29/18 2337 08/30/18 0024 08/30/18 0516  BP: 108/65  (!) 85/39 (!) 89/53  Pulse: (!) 109   (!) 110  Resp: 19   20  Temp: 97.8 F (36.6 C) 98.1 F (36.7 C) 97.8 F (36.6 C) 97.9 F (36.6 C)  TempSrc: Axillary Axillary Axillary Axillary  SpO2: 100%   100%  Weight:    104.3 kg  Height:       Gen: somnolent, oriented x3 when aroused, pale CV: RRR, no murmurs Pulm: bibasilar crackles Abd: bowel sounds present, soft, non-distended, non-tender Ext: 1+ pitting edema BLE Skin: persistent petechiae of the dorsal bilateral hands and feet, the left big toe appears more violaceous and is cold  Assessment/Plan:  Principal Problem:   DIC (disseminated intravascular coagulation) ([place]) Active Problems:   Pancreatic mass   Acute kidney injury ([place])   Encephalopathy  Ms. Berrios is a 68 yo female with a medical history of HTN, afib and prior DVT on Eliquis, CVA, systolic heart failure (EF 30-35% on 08/12/2018), who presented to the ED from her SNF with weakness and rash. She was hypotensive on arrival, with modest improvement in her blood pressure to the 16X systolic after 3L NS. She has petechia and palpable purpura with a platelet count of 40 and stable hemoglobin. CT abdomen pelvis showed probable metastatic pancreatic cancer.  Hypotension DIC Pancreatic mass - Most recent blood pressures were 85/39 and 89/53. Patient  received 4L NS yesterday and was started on gentle maintenance fluids. She appears volume overloaded on exam with BLE pitting edema and bibasilar crackles, but denies dyspnea and is oxygenating well on room air. If her pressures decrease further, she may not be able to tolerate more IVF.  - DIC panel showed elevated PT, INR, PTT, d-dimer. Normal fibrinogen. Low platelets at 39. No schistocytes seen on blood smear. DIC is likely 2/2 malignancy. No signs of infection at this time. Her coagulopathies may also represent hepatic dysfunction in the setting of metastases. If the underlying cause of her DIC is not treated, she has a poor prognosis of less than two weeks. Her family is hoping to transition her to hospice. Will continue to address goals of care with patient. - Her toe appears to be necrosing. Given her platelet count, she is not a candidate for a surgical intervention at this time.   - Repeat CBC this morning with Hb 9.7 and platelet count 40. - Urine cx with no growth. Blood cx with no growth in 24 hrs. Plan - Oncology consulted, appreciate recs - Social work consulted, appreciate recs - Pallative consulted, appreciate recs - Vitamin K 5mg  daily. No heparin at this time.  - F/u haptoglobin and CA-19-9  - Trend CBC - Hold home eliquis  Anion gap metabolic acidosis - Bicarb 19. Anion gap 15. Likely lactic acidosis vs starvation ketosis. Lactate now wnl.  Plan - Trend BMP   AKI - Cr decreased from 1.4 on admission to  1.3 today. Basline < 1. Likely prerenal in the setting of dehydration. Plan - IVF  - Trend Cr  Elevated troponin  - Troponin stable. 0.09 --> 0.08 --> 0.09. Repeat EKG shows persistent afib and less severe t wave inversions.  - Likely demand ischemia in the setting of hypotension and DIC  Dispo: Will likely discharge to hospice when bed available.   Atari Novick, Andree Elk, MD 08/30/2018, 6:24 AM Pager: [phone]

## 2018-08-30 NOTE — Progress Notes (Signed)
[facility] CONSULT NOTE  Patient Care Team: System, Provider Not In as PCP - General  ASSESSMENT & PLAN:   1) Acute on chronic thrombocytopenia likely due to consumption versus reduced production in the setting of liver metastasis 2) Recent DVT and stroke, with ongoing signs of thrombosis in her extremities, despite anticoagulation therapy with Xarelto and Eliquis, likely due to hypercoagulopathy from untreated pancreatic cancer 3) Metastatic pancreatic cancer to the liver, lung, lymph nodes although not biopsy proven 4) Synthetic liver dysfunction 5) Cardiomyopathy  I reviewed all the laboratory findings, clinical findings, natural history of untreated pancreatic cancer and her ongoing thrombosis due to hypercoagulopathy state with the patient and family members  Unfortunately, she is not a candidate to undergo definitive biopsy and ultimate treatment for her pancreatic cancer and due to her other significant co-morbidities  The patient and family members appear to have accepted her terminal situation They are in agreement for her CODE STATUS to remain with DO NOT RESUSCITATE They are in agreement to transition her care to comfort/palliative measures only They agree not to undergo further blood draws, electrolyte replacement, anticoagulation therapy, imaging studies, and telemetry monitoring Social worker has been consulted for placement to residential facility I estimate her prognosis to be less than 14 days and I think residential hospice facility is appropriate in this situation The case has been discussed with the primary service. I will sign off  I spent 50 minutes counseling the patient face to face. The total time spent in the appointment was 70 minutes and more than 50% was on counseling.  All questions were answered. The patient knows to call the clinic with any problems, questions or concerns.  Heath Lark, MD 08/30/2018 12:04 PM  CHIEF COMPLAINTS/PURPOSE OF  CONSULTATION:  Severe thrombocytopenia, cardiomyopathy, coagulopathy, recent DVT and stroke, abnormal imaging studies suspicious for metastatic pancreatic cancer to LN and liver  HISTORY OF PRESENTING ILLNESS:  Diana Todd 68 y.o. female is seen at the request from primary service due to abnormal presentation to the hospital and admission for abnormal skin rash, labs and imaging study Per daughter, Estill Bamberg and her sister were also present in the room. The patient is a retired Recruitment consultant and worked part-time at [employer] prior to her illness. She presented to the family medicine service back around January of this year with diagnosis of acute lower extremity thrombosis and was anticoagulated with Xarelto. The latest one, she presented with altered mental status. CT imaging study along with MRI shows significant embolic stroke in numerous different location of the brain. Echocardiogram showed cardiomyopathy and she has abnormal heart rhythm. She was anticoagulated and her treatment was switched to Eliquis. The patient started to decline overall with appetite and energy. Prior to discharge to skilled nursing facility, she was noted to have abnormal platelet count and other laboratory abnormality. Yesterday, she was brought in after presentation with diffuse bruising and petechiae or rash. On 08/17/2018, the platelet count was 96,000 Yesterday, her platelet count was down to 46,000 Further coagulation study revealed abnormally high PT, INR, APTT but with normal fibrinogen level. She was given treatment with cryoprecipitate due to concern for possible DIC. Serum chemistries were grossly abnormal with elevated alkaline phosphatase, total bilirubin level, low serum albumin and low elevated serum creatinine.  CT imaging of her abdomen and pelvis revealed evidence of pulmonary metastasis, numerous liver metastasis, pancreatic tail mass, wedge shaped infarct of the spleen, diffuse lymphadenopathy and  evidence of fluid ascites. I was consulted  to help address her overall presentation, suspicious for metastatic pancreatic cancer with evidence of diffuse petechiae rash and clots  MEDICAL HISTORY:  Past Medical History:  Diagnosis Date  . Anxiety   . DVT (deep venous thrombosis) ([place])   . Hypertension     SURGICAL HISTORY: Past Surgical History:  Procedure Laterality Date  . NO PAST SURGERIES      SOCIAL HISTORY: Social History   Socioeconomic History  . Marital status: Unknown    Spouse name: Not on file  . Number of children: Not on file  . Years of education: Not on file  . Highest education level: Not on file  Occupational History  . Not on file  Social Needs  . Financial resource strain: Not on file  . Food insecurity:    Worry: Not on file    Inability: Not on file  . Transportation needs:    Medical: Not on file    Non-medical: Not on file  Tobacco Use  . Smoking status: Never Smoker  . Smokeless tobacco: Never Used  Substance and Sexual Activity  . Alcohol use: Never    Frequency: Never  . Drug use: Never  . Sexual activity: Not on file  Lifestyle  . Physical activity:    Days per week: Not on file    Minutes per session: Not on file  . Stress: Not on file  Relationships  . Social connections:    Talks on phone: Not on file    Gets together: Not on file    Attends religious service: Not on file    Active member of club or organization: Not on file    Attends meetings of clubs or organizations: Not on file    Relationship status: Not on file  . Intimate partner violence:    Fear of current or ex partner: Not on file    Emotionally abused: Not on file    Physically abused: Not on file    Forced sexual activity: Not on file  Other Topics Concern  . Not on file  Social History Narrative  . Not on file    FAMILY HISTORY: Family History  Problem Relation Age of Onset  . Breast cancer Mother   . AAA (abdominal aortic aneurysm) Father      ALLERGIES:  is allergic to macrodantin [nitrofurantoin macrocrystal].  MEDICATIONS:  Current Facility-Administered Medications  Medication Dose Route Frequency Provider Last Rate Last Dose  . acetaminophen (TYLENOL) tablet 650 mg  650 mg Oral Q6H PRN Dorrell, Andree Elk, MD       Or  . acetaminophen (TYLENOL) suppository 650 mg  650 mg Rectal Q6H PRN Dorrell, Andree Elk, MD      . antiseptic oral rinse (BIOTENE) solution 15 mL  15 mL Topical PRN Dorrell, Andree Elk, MD      . Chlorhexidine Gluconate Cloth 2 % PADS 6 each  6 each Topical Q0600 Oda Kilts, MD   6 each at 08/30/18 [phone]  . glycopyrrolate (ROBINUL) tablet 1 mg  1 mg Oral Q4H PRN Dorrell, Andree Elk, MD       Or  . glycopyrrolate (ROBINUL) injection 0.2 mg  0.2 mg Subcutaneous Q4H PRN Dorrell, Andree Elk, MD       Or  . glycopyrrolate (ROBINUL) injection 0.2 mg  0.2 mg Intravenous Q4H PRN Dorrell, Andree Elk, MD      . morphine CONCENTRATE 10 MG/0.5ML oral solution 5 mg  5 mg Oral Q2H PRN Dorrell, Andree Elk, MD  Or  . morphine CONCENTRATE 10 MG/0.5ML oral solution 5 mg  5 mg Sublingual Q2H PRN Dorrell, Andree Elk, MD      . mupirocin ointment (BACTROBAN) 2 % 1 application  1 application Nasal BID Oda Kilts, MD   1 application at 19/37/90 1027  . ondansetron (ZOFRAN-ODT) disintegrating tablet 4 mg  4 mg Oral Q6H PRN Dorrell, Andree Elk, MD       Or  . ondansetron (ZOFRAN) injection 4 mg  4 mg Intravenous Q6H PRN Dorrell, Andree Elk, MD      . polyvinyl alcohol (LIQUIFILM TEARS) 1.4 % ophthalmic solution 1 drop  1 drop Both Eyes QID PRN Dorrell, Andree Elk, MD        REVIEW OF SYSTEMS:   Constitutional: Denies fevers, chills or abnormal night sweats Eyes: Denies blurriness of vision, double vision or watery eyes Ears, nose, mouth, throat, and face: Denies mucositis or sore throat Respiratory: Denies cough, dyspnea or wheezes Cardiovascular: Denies palpitation, chest discomfort or lower extremity  swelling Gastrointestinal:  Denies nausea, heartburn or change in bowel habits Lymphatics: Denies new lymphadenopathy or easy bruising Behavioral/Psych: Mood is stable, no new changes  All other systems were reviewed with the patient and are negative.  PHYSICAL EXAMINATION: ECOG PERFORMANCE STATUS: 3 - Symptomatic, >50% confined to bed  Vitals:   08/30/18 0818 08/30/18 1008  BP:  105/66  Pulse: (!) 114 (!) 108  Resp: 16 (!) 23  Temp:    SpO2: 99% 100%   Filed Weights   08/29/18 1005 08/29/18 1724 08/30/18 0516  Weight: 229 lb (103.9 kg) 227 lb 1.2 oz (103 kg) 230 lb (104.3 kg)    GENERAL:alert, no distress and comfortable. She looks pale SKIN:diffuse petechiae rash throughout with evidence of ischemia/gangrene is looking toes on both legs EYES: normal, conjunctiva are pink and non-injected, sclera clear Musculoskeletal:no cyanosis of digits and no clubbing  PSYCH: alert & oriented x 3 with fluent speech  LABORATORY DATA:  I have reviewed the data as listed Lab Results  Component Value Date   WBC 11.8 (H) 08/30/2018   HGB 9.7 (L) 08/30/2018   HCT 31.9 (L) 08/30/2018   MCV 93.3 08/30/2018   PLT 40 (L) 08/30/2018   Recent Labs    08/17/18 1207 08/29/18 1034 08/30/18 0747  NA 132* 140 142  K 4.4 3.5 3.3*  CL 99 105 108  CO2 23 18* 19*  GLUCOSE 103* 123* 88  BUN 19 34* 30*  CREATININE 0.74 1.42* 1.34*  CALCIUM 8.6* 8.9 8.2*  GFRNONAA >60 38* 41*  GFRAA >60 44* 47*  PROT 6.2* 6.3* 5.3*  ALBUMIN 2.8* 3.1* 2.5*  AST 45* 39 35  ALT 19 28 22   ALKPHOS 237* 293* 249*  BILITOT 1.3* 1.6* 1.3*    RADIOGRAPHIC STUDIES: I have personally reviewed the radiological images as listed and agreed with the findings in the report. Dg Chest 2 View  Result Date: 08/10/2018 CLINICAL DATA:  Confusion EXAM: CHEST - 2 VIEW COMPARISON:  None. FINDINGS: Lungs are under aerated with bibasilar atelectasis. The heart is a accentuated by low volumes. It is likely normal in size. No  pneumothorax or pleural effusion. Normal vascularity. IMPRESSION: Bibasilar atelectasis. Electronically Signed   By: Marybelle Killings M.D.   On: 08/10/2018 08:48   Ct Head Wo Contrast  Result Date: 08/29/2018 CLINICAL DATA:  Altered mental status. Recently started on Coumadin. EXAM: CT HEAD WITHOUT CONTRAST TECHNIQUE: Contiguous axial images were obtained from the base of the skull  through the vertex without intravenous contrast. COMPARISON:  Head CT dated 08/10/2018. Brain MRI dated 08/11/2018. FINDINGS: Brain: Mild generalized parenchymal volume loss with commensurate dilatation of the ventricles and sulci. No hydrocephalus. Mild chronic small vessel ischemic changes within the bilateral periventricular white matter regions. No mass, hemorrhage, edema or other evidence of acute parenchymal abnormality. No extra-axial hemorrhage. Vascular: Chronic calcified atherosclerotic changes of the large vessels at the skull base. No unexpected hyperdense vessel. Skull: Normal. Negative for fracture or focal lesion. Sinuses/Orbits: Mucosal thickening within the LEFT maxillary sinus, of uncertain age. Paranasal sinuses otherwise clear. Periorbital and retro-orbital soft tissues are unremarkable. Other: None. IMPRESSION: 1. No acute findings. No intracranial mass, hemorrhage or edema. 2. Mild atrophy and chronic small vessel ischemic changes in the white matter. Electronically Signed   By: Franki Cabot M.D.   On: 08/29/2018 13:03   Ct Head Wo Contrast  Result Date: 08/10/2018 CLINICAL DATA:  Encephalopathy EXAM: CT HEAD WITHOUT CONTRAST TECHNIQUE: Contiguous axial images were obtained from the base of the skull through the vertex without intravenous contrast. COMPARISON:  11/21/2017 FINDINGS: Brain: Stable old lacunar infarct in the left internal capsule. No acute intracranial abnormality. Specifically, no hemorrhage, hydrocephalus, mass lesion, acute infarction, or significant intracranial injury. Vascular: No hyperdense  vessel or unexpected calcification. Skull: No acute calvarial abnormality. Sinuses/Orbits: Mucosal thickening and air-fluid level in the left maxillary sinus. Other: None IMPRESSION: No acute intracranial abnormality. Stable old left internal capsule lacunar infarct. Acute on chronic left maxillary sinusitis. Electronically Signed   By: Rolm Baptise M.D.   On: 08/10/2018 09:57   Mr Brain Wo Contrast  Result Date: 08/11/2018 CLINICAL DATA:  68 y/o  F; acute toxic metabolic encephalopathy. EXAM: MRI HEAD WITHOUT CONTRAST TECHNIQUE: Coronal DWI, axial DWI, and axial T2 FLAIR weighted sequences were acquired. The patient was unable to continue and additional sequences were not acquired. COMPARISON:  None. FINDINGS: Motion degraded sequences. There are numerous subcentimeter foci of reduced diffusion scattered throughout the supratentorial brain and cerebellum predominantly within cortical in juxta cortical white matter compatible with acute/early subacute infarctions. The multiple infarctions are T2 FLAIR hyperintense. No associated mass effect. On the T2 FLAIR weighted sequence there is a background of mild chronic microvascular ischemic changes and volume loss of the brain. No abnormal CSF signal to suggest extra-axial collection. No hydrocephalus, mass effect, or herniation. Left maxillary sinus disease and fluid level. IMPRESSION: 1. Motion degraded and limited study. 2. Numerous subcentimeter foci of reduced diffusion compatible acute/early subacute infarction throughout the supratentorial brain and cerebellum. Multiple vascular territories favors embolic phenomenon. No associated mass effect. 3. Background of mild chronic microvascular ischemic changes and volume loss of the brain. 4. Left maxillary sinus disease and fluid level. These results will be called to the ordering clinician or representative by the Radiologist Assistant, and communication documented in the PACS or zVision Dashboard. Electronically  Signed   By: Kristine Garbe M.D.   On: 08/11/2018 21:56   Ct Abdomen Pelvis W Contrast  Result Date: 08/29/2018 CLINICAL DATA:  Generalized abdominal pain beginning today. Recently started on Coumadin. EXAM: CT ABDOMEN AND PELVIS WITH CONTRAST TECHNIQUE: Multidetector CT imaging of the abdomen and pelvis was performed using the standard protocol following bolus administration of intravenous contrast. CONTRAST:  63mL OMNIPAQUE IOHEXOL 300 MG/ML  SOLN COMPARISON:  None. FINDINGS: Lower chest: Masslike consolidation at the RIGHT lung base, overlying the RIGHT hemidiaphragm and abutting the posterior pleural surface, measuring 3.1 cm (series 5, image 35). Pleural thickening and/or  small pleural effusion at the RIGHT lung base. Hepatobiliary: Numerous hypodense masses throughout the bilateral liver lobes, largest located within the anterior superior RIGHT liver lobe measuring 4.8 cm (series 3, image 17). Next largest mass is seen exophytic to the posterior inferior margin of the RIGHT liver lobe measuring 3.7 cm (series 3, image 42). Small perihepatic free fluid. Gallbladder is unremarkable. No bile duct dilatation seen. Pancreas: Pancreatic tail mass measures approximately 6.2 x 4.5 cm (series 3, image 21). Spleen: Wedge-shaped peripheral hypodense lesion within the spleen measures 4 cm greatest dimension, suspected splenic infarct. Adrenals/Urinary Tract: Adrenal glands are unremarkable. Small hypodense masses within the RIGHT kidney, too small to characterize. No hydronephrosis bilaterally. Bladder is unremarkable. Stomach/Bowel: No dilated large or small bowel loops. Stomach is unremarkable, partially decompressed. Vascular/Lymphatic: Suspected obstruction/occlusion of the splenic vein. No other acute or significant vascular abnormality identified. Confluent lymphadenopathy at the porta hepatis, likely metastatic. Additional adenopathy versus metastatic soft tissue implant within the lower mid  mediastinum, periesophageal, measuring 1.6 cm short axis dimension (series 3, image 9). Reproductive: Uterus and bilateral adnexa are unremarkable. Other: Scattered nodular implants throughout the anterior mesentery, highly suggestive of metastatic implants. Musculoskeletal: Degenerative spondylosis throughout the thoracolumbar spine, mild to moderate in degree. No acute or suspicious osseous finding. IMPRESSION: 1. Rounded masslike consolidation at the right lung base, measuring 3.1 cm, highly suspicious for pulmonary metastasis, much less likely rounded pneumonia. Associated pleural thickening and/or small pleural effusion at the right lung base, possibly additional malignant metastasis. 2. Numerous hypodense masses throughout the liver, largest within the anterior superior right liver lobe measuring 4.8 cm. These are consistent with metastatic disease. 3. Pancreatic tail mass, measuring 6.2 x 4.5 cm, almost certainly representing the primary neoplastic mass. 4. Wedge-shaped peripheral hypodense lesion within the spleen, measuring 4 cm greatest dimension, suspected splenic infarct. Suspect associated occlusion/obstruction of the splenic vein by the aforementioned pancreatic mass. 5. Confluent lymphadenopathy at the porta hepatis, consistent with metastatic lymphadenopathy. 6. Scattered nodular implants throughout the anterior mesentery, consistent with metastatic implants. 7. Small perihepatic free fluid. These results were called by telephone at the time of interpretation on 08/29/2018 at 1:15 pm to Dr. Isla Pence , who verbally acknowledged these results. Electronically Signed   By: Franki Cabot M.D.   On: 08/29/2018 13:17   US Carotid Bilateral  Result Date: 08/12/2018 CLINICAL DATA:  CVA EXAM: BILATERAL CAROTID DUPLEX ULTRASOUND TECHNIQUE: Pearline Cables scale imaging, color Doppler and duplex ultrasound were performed of bilateral carotid and vertebral arteries in the neck. COMPARISON:  None. FINDINGS: Criteria:  Quantification of carotid stenosis is based on velocity parameters that correlate the residual internal carotid diameter with NASCET-based stenosis levels, using the diameter of the distal internal carotid lumen as the denominator for stenosis measurement. The following velocity measurements were obtained: RIGHT ICA: 85 cm/sec CCA: 761 cm/sec SYSTOLIC ICA/CCA RATIO:  0.8 ECA: 103 cm/sec LEFT ICA: 91 cm/sec CCA: 93 cm/sec SYSTOLIC ICA/CCA RATIO:  1.0 ECA: 51 cm/sec RIGHT CAROTID ARTERY: Little if any plaque in the bulb. Low resistance internal carotid Doppler pattern. RIGHT VERTEBRAL ARTERY:  Antegrade. LEFT CAROTID ARTERY: Little if any plaque in the bulb. Low resistance internal carotid Doppler pattern. LEFT VERTEBRAL ARTERY:  Antegrade. IMPRESSION: Less than 50% stenosis in the right and left internal carotid arteries. Electronically Signed   By: Marybelle Killings M.D.   On: 08/12/2018 10:36   Dg Chest Portable 1 View  Result Date: 08/29/2018 CLINICAL DATA:  Rash over abdomen, hands and feet. EXAM: PORTABLE  CHEST 1 VIEW COMPARISON:  08/10/2018 FINDINGS: Lungs are somewhat hypoinflated without focal airspace consolidation or effusion. Mild stable cardiomegaly. Remainder of the exam is unchanged. IMPRESSION: Hypoinflation without acute cardiopulmonary disease. Mild stable cardiomegaly. Electronically Signed   By: Marin Olp M.D.   On: 08/29/2018 11:20

## 2018-08-30 NOTE — TOC Transition Note (Signed)
Transition of Care [facility]) - CM/SW Discharge Note   Patient Details  Name: ADRIONNA DELCID MRN: [id number] Date of Birth: Dec 21, 1950  [facility]) CM/SW Contact:  Malon Kindle, LCSW Phone Number: [phone] 08/30/2018, 3:19 PM   Clinical Narrative:     Clinical Social Worker facilitated patient discharge including contacting patient family and facility to confirm patient discharge plans.  Clinical information faxed to facility and family agreeable with plan.  CSW arranged ambulance transport via [facility] to [place].  RN to call report prior to discharge [phone]).  Clinical Social Worker will sign off for now as social work intervention is no longer needed. Please consult [place] again if new need arises.    Final next level of care: [place] Barriers to Discharge: No Barriers Identified   Patient Goals and CMS Choice Patient states their goals for this hospitalization and ongoing recovery are:: Patient family would like for patient to experience comfort at the end of life CMS Medicare.gov Compare Post Acute Care list provided to:: Other (Comment Required)(No list needed) Choice offered to / list presented to : Adult Children  Discharge Placement              Patient chooses bed at: Other - please specify in the comment section below:([facility]) Patient to be transferred to facility by: Ambulance Name of family member notified: Estill Bamberg - patient daughter at bedside Patient and family notified of of transfer: 08/30/18  Discharge Plan and Services In-house Referral: Clinical Social Work   Post Acute Care Choice: Hospice                    Social Determinants of Health (SDOH) Interventions     Readmission Risk Interventions No flowsheet data found.

## 2018-08-31 ENCOUNTER — Other Ambulatory Visit: Payer: Self-pay | Admitting: *Deleted

## 2018-08-31 LAB — CANCER ANTIGEN 19-9: CA 19-9: 42007 U/mL — ABNORMAL HIGH (ref 0–35)

## 2018-08-31 NOTE — Patient Outreach (Signed)
[facility]) Care Management  08/31/2018  Diana Todd 1950-11-22 [id number]   Noted per Epic chart review that patient readmitted to the hospital over the weekend, then discharged to Hospice home 08/30/18 No [facility] care management needs identified.  Will sign off. Royetta Crochet. Laymond Purser, MSN, RN, [employer] , [place] [phone]) Business Cell  [phone]) Toll Free Office

## 2018-09-03 LAB — CULTURE, BLOOD (ROUTINE X 2)
Culture: NO GROWTH
Culture: NO GROWTH
Special Requests: ADEQUATE
Special Requests: ADEQUATE

## 2018-09-08 DIAGNOSIS — Z515 Encounter for palliative care: Secondary | ICD-10-CM

## 2018-09-08 DIAGNOSIS — Z7189 Other specified counseling: Secondary | ICD-10-CM

## 2018-09-09 DEATH — deceased

## 2019-09-06 IMAGING — CT CT ABDOMEN AND PELVIS WITH CONTRAST
2 of 5 series · 15 of 46 positions shown, 17 images · IV contrast (APPLIED)
Comparison: None.

CLINICAL DATA: Generalized abdominal pain beginning today. Recently
started on Coumadin.

EXAM:
CT ABDOMEN AND PELVIS WITH CONTRAST
TECHNIQUE: Multidetector CT imaging of the abdomen and pelvis was performed
using the standard protocol following bolus administration of
intravenous contrast.
CONTRAST:  75mL OMNIPAQUE IOHEXOL 300 MG/ML  SOLN

[Series 3: abdomen 5.0 · axial · 0.98mm/px · z∈[-839,-424]mm · 12 of 97 slices shown, 14 images]
[im 7/97  soft-tissue]
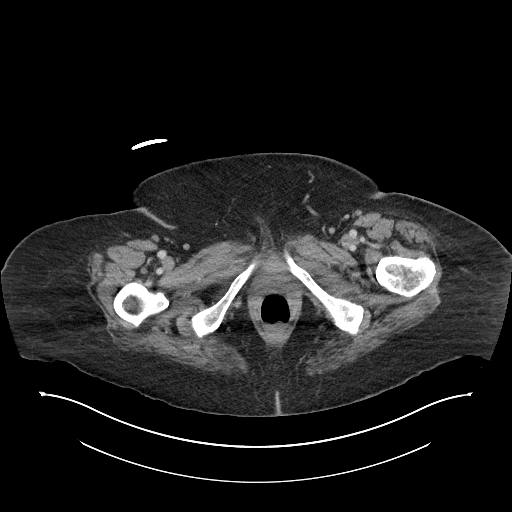
[im 7/97  bone]
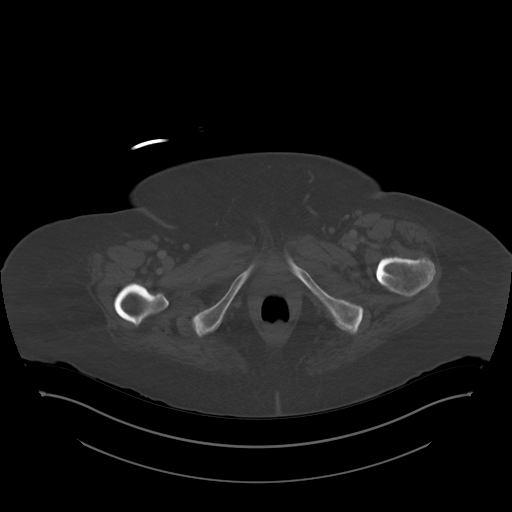
[im 14/97  soft-tissue]
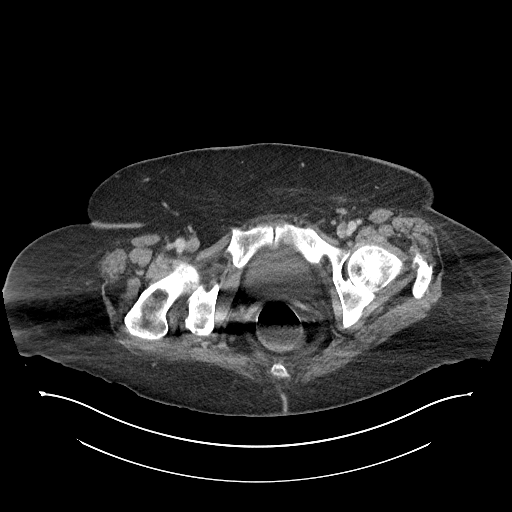
[im 21/97  soft-tissue]
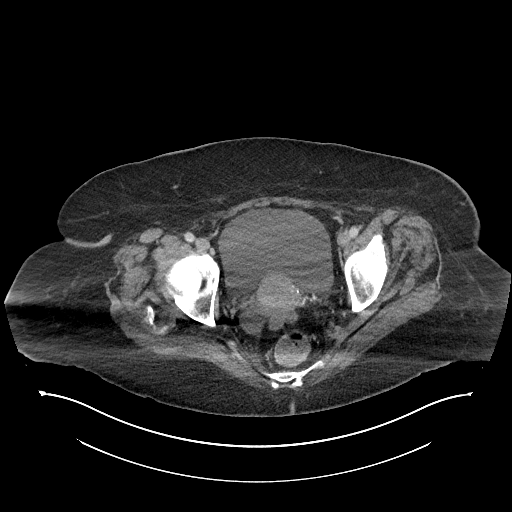
[im 28/97  soft-tissue]
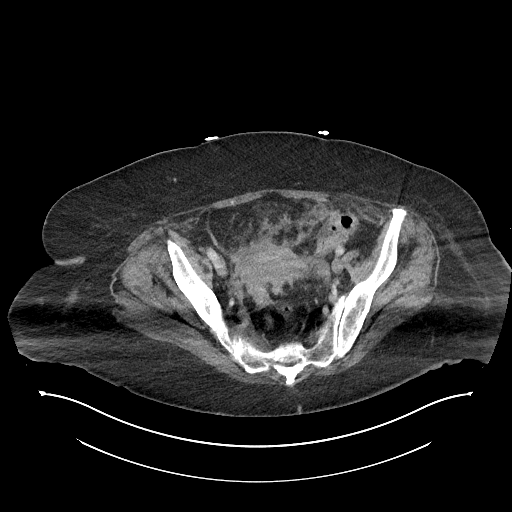
[im 35/97  soft-tissue]
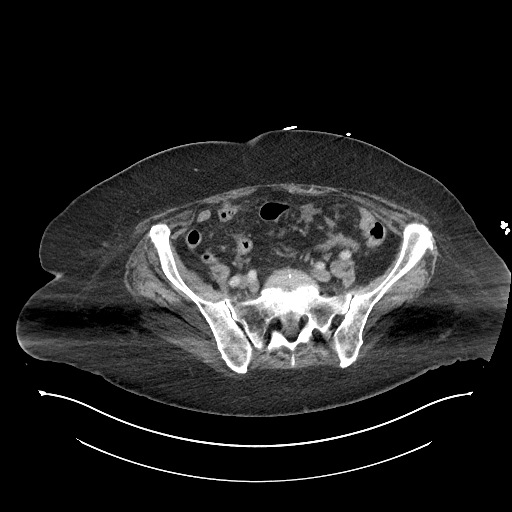
[im 42/97  soft-tissue]
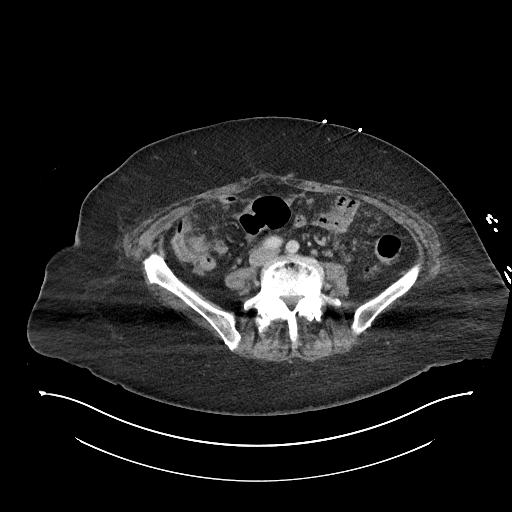
[im 55/97  soft-tissue]
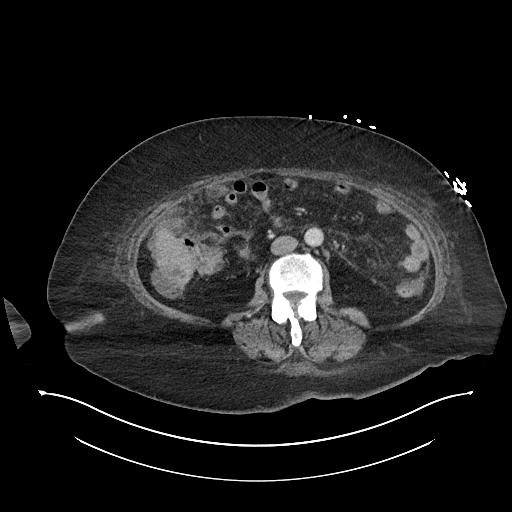
[im 62/97  soft-tissue]
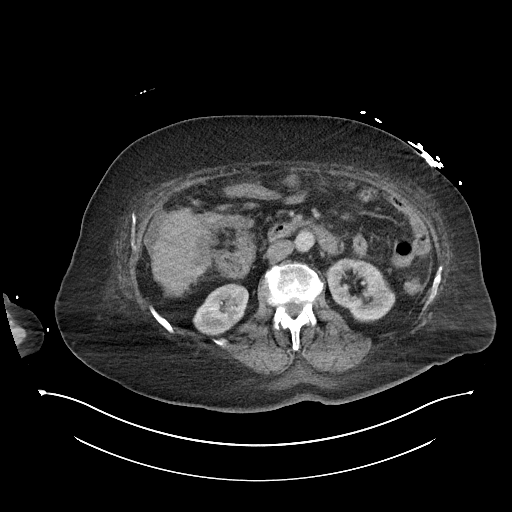
[im 69/97  soft-tissue]
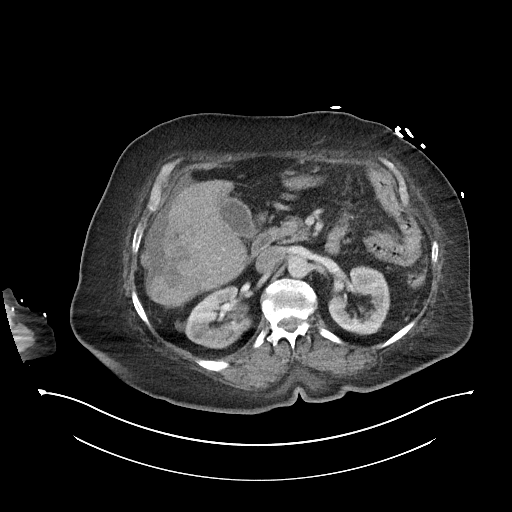
[im 69/97  bone]
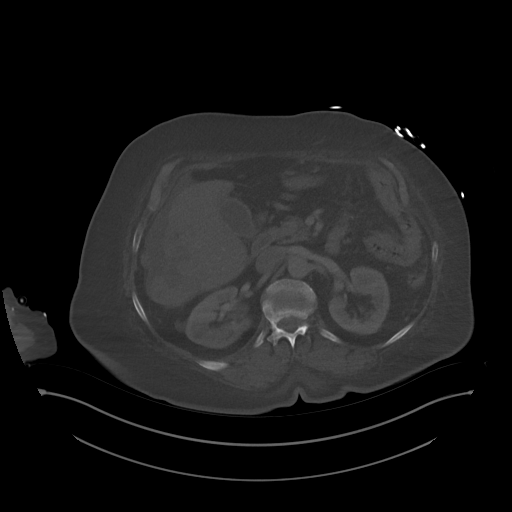
[im 76/97  soft-tissue]
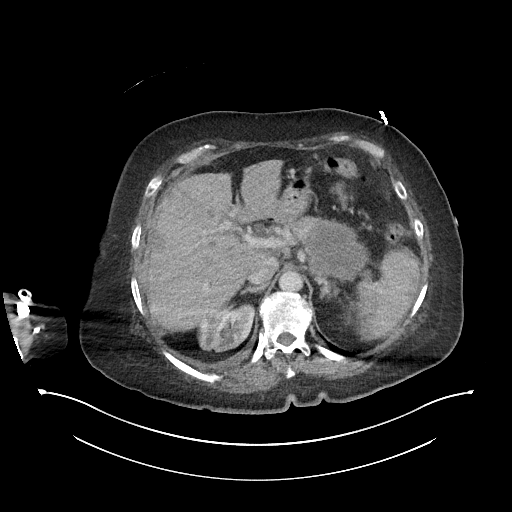
[im 83/97  soft-tissue]
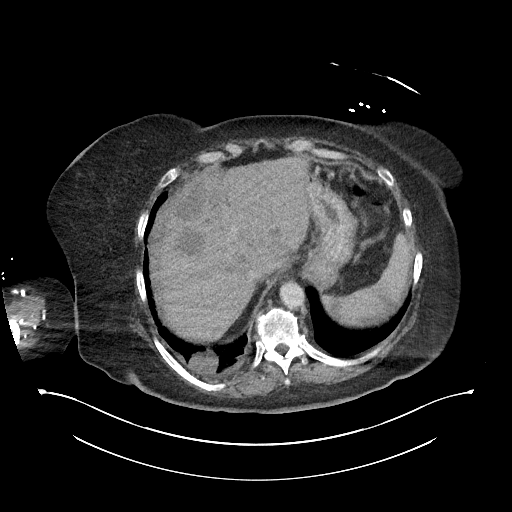
[im 90/97  soft-tissue]
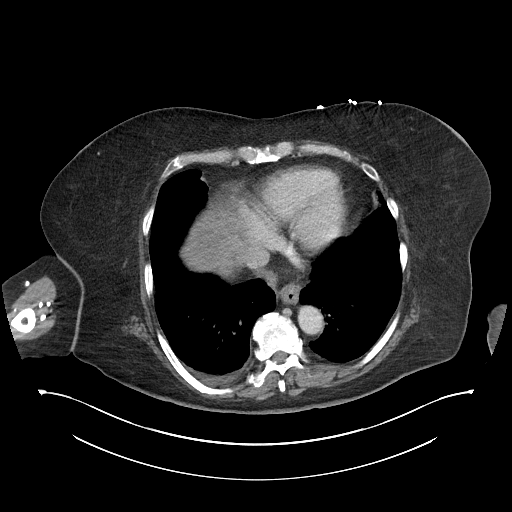

[Series 6: abdomen 3.0 mpr cor · coronal · 0.83mm/px · 3 of 89 slices shown]
[im 30/89  soft-tissue]
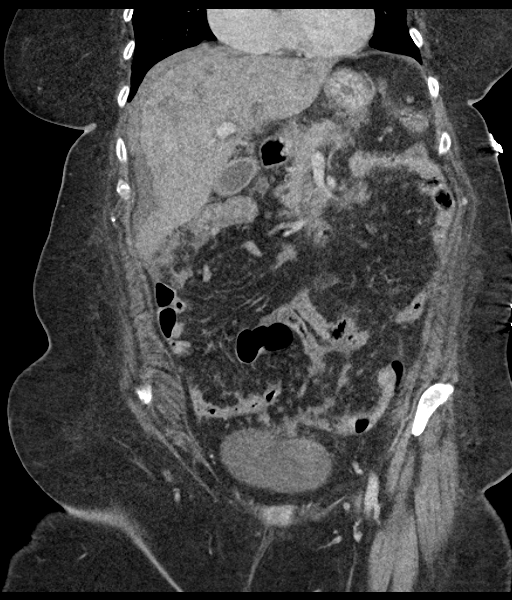
[im 40/89  soft-tissue]
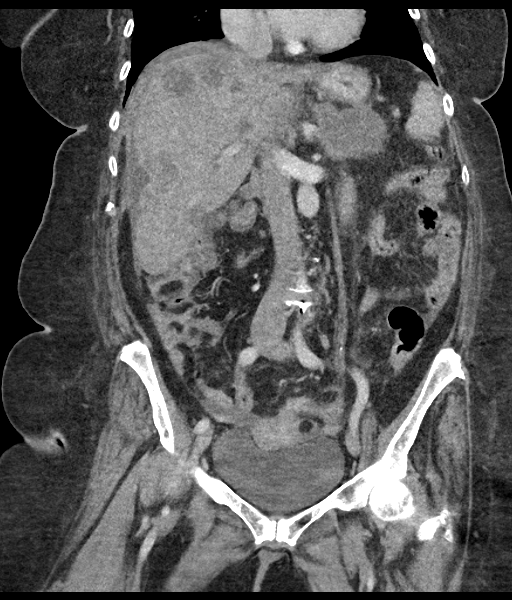
[im 49/89  soft-tissue]
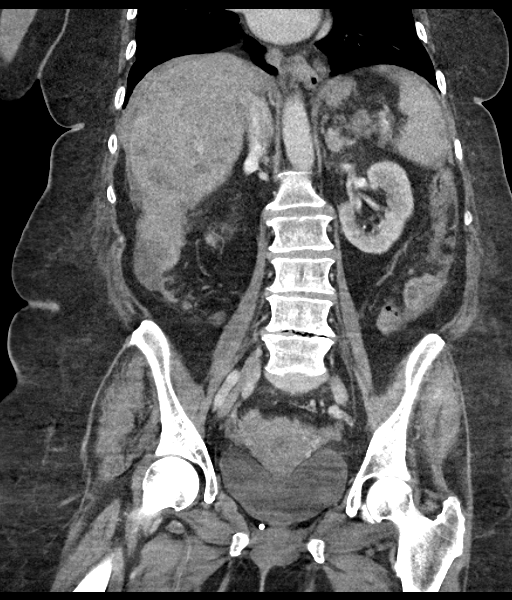

[15 of 46 positions shown; findings below may reference images not displayed]

FINDINGS: Lower chest: Masslike consolidation at the RIGHT lung base,
overlying the RIGHT hemidiaphragm and abutting the posterior pleural
surface, measuring 3.1 cm (series 5, image 35). Pleural thickening
and/or small pleural effusion at the RIGHT lung base.

Hepatobiliary: Numerous hypodense masses throughout the bilateral
liver lobes, largest located within the anterior superior RIGHT
liver lobe measuring 4.8 cm (series 3, image 17). Next largest mass
is seen exophytic to the posterior inferior margin of the RIGHT
liver lobe measuring 3.7 cm (series 3, image 42). Small perihepatic
free fluid.

Gallbladder is unremarkable. No bile duct dilatation seen.

Pancreas: Pancreatic tail mass measures approximately 6.2 x 4.5 cm
(series 3, image 21).

Spleen: Wedge-shaped peripheral hypodense lesion within the spleen
measures 4 cm greatest dimension, suspected splenic infarct.

Adrenals/Urinary Tract: Adrenal glands are unremarkable. Small
hypodense masses within the RIGHT kidney, too small to characterize.
No hydronephrosis bilaterally. Bladder is unremarkable.

Stomach/Bowel: No dilated large or small bowel loops. Stomach is
unremarkable, partially decompressed.

Vascular/Lymphatic: Suspected obstruction/occlusion of the splenic
vein. No other acute or significant vascular abnormality identified.
Confluent lymphadenopathy at the porta hepatis, likely metastatic.
Additional adenopathy versus metastatic soft tissue implant within
the lower mid mediastinum, periesophageal, measuring 1.6 cm short
axis dimension (series 3, image 9).

Reproductive: Uterus and bilateral adnexa are unremarkable.

Other: Scattered nodular implants throughout the anterior mesentery,
highly suggestive of metastatic implants.

Musculoskeletal: Degenerative spondylosis throughout the
thoracolumbar spine, mild to moderate in degree. No acute or
suspicious osseous finding.
IMPRESSION: 1. Rounded masslike consolidation at the right lung base, measuring
3.1 cm, highly suspicious for pulmonary metastasis, much less likely
rounded pneumonia. Associated pleural thickening and/or small
pleural effusion at the right lung base, possibly additional
malignant metastasis.
2. Numerous hypodense masses throughout the liver, largest within
the anterior superior right liver lobe measuring 4.8 cm. These are
consistent with metastatic disease.
3. Pancreatic tail mass, measuring 6.2 x 4.5 cm, almost certainly
representing the primary neoplastic mass.
4. Wedge-shaped peripheral hypodense lesion within the spleen,
measuring 4 cm greatest dimension, suspected splenic infarct.
Suspect associated occlusion/obstruction of the splenic vein by the
aforementioned pancreatic mass.
5. Confluent lymphadenopathy at the porta hepatis, consistent with
metastatic lymphadenopathy.
6. Scattered nodular implants throughout the anterior mesentery,
consistent with metastatic implants.
7. Small perihepatic free fluid.

These results were called by telephone at the time of interpretation
on 08/29/2018 at [DATE] to Dr. JULLON LIENAD , who verbally
acknowledged these results.
